# Patient Record
Sex: Female | Born: 1995 | Race: Black or African American | Hispanic: No | Marital: Single | Smoking: Former smoker
Health system: Southern US, Community
[De-identification: ages and names within clinical notes are randomized; demographics above are authoritative.]

## PROBLEM LIST (undated history)

## (undated) DIAGNOSIS — Z30017 Encounter for initial prescription of implantable subdermal contraceptive: Principal | ICD-10-CM

## (undated) DIAGNOSIS — R87629 Unspecified abnormal cytological findings in specimens from vagina: Secondary | ICD-10-CM

## (undated) DIAGNOSIS — Z789 Other specified health status: Secondary | ICD-10-CM

## (undated) HISTORY — DX: Other specified health status: Z78.9

## (undated) HISTORY — PX: KNEE SURGERY: SHX244

## (undated) HISTORY — DX: Unspecified abnormal cytological findings in specimens from vagina: R87.629

## (undated) HISTORY — DX: Encounter for initial prescription of implantable subdermal contraceptive: Z30.017

---
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
## 2016-01-31 ENCOUNTER — Encounter: Payer: Medicaid Other | Admitting: Advanced Practice Midwife

## 2016-01-31 ENCOUNTER — Encounter: Payer: Self-pay | Admitting: Advanced Practice Midwife

## 2016-02-28 ENCOUNTER — Encounter: Payer: Medicaid Other | Admitting: Advanced Practice Midwife

## 2016-03-13 ENCOUNTER — Encounter: Payer: Medicaid Other | Admitting: Advanced Practice Midwife

## 2016-03-26 ENCOUNTER — Ambulatory Visit (INDEPENDENT_AMBULATORY_CARE_PROVIDER_SITE_OTHER): Payer: Medicaid Other | Admitting: Advanced Practice Midwife

## 2016-03-26 ENCOUNTER — Encounter: Payer: Self-pay | Admitting: Advanced Practice Midwife

## 2016-03-26 VITALS — BP 116/64 | HR 88 | Wt 153.0 lb

## 2016-03-26 DIAGNOSIS — Z308 Encounter for other contraceptive management: Secondary | ICD-10-CM | POA: Diagnosis not present

## 2016-03-26 DIAGNOSIS — Z113 Encounter for screening for infections with a predominantly sexual mode of transmission: Secondary | ICD-10-CM

## 2016-03-26 DIAGNOSIS — Z01419 Encounter for gynecological examination (general) (routine) without abnormal findings: Secondary | ICD-10-CM

## 2016-03-26 NOTE — Progress Notes (Signed)
Erica Doyle 20 y.o.  Vitals:   03/26/16 1026  BP: 116/64  Pulse: 88     Filed Weights   03/26/16 1026  Weight: 153 lb (69.4 kg)    Past Medical History: Past Medical History:  Diagnosis Date  . Nexplanon insertion 12/31/2012   nexplanon inserted left arm 12/31/12 remove 01/01/16    Past Surgical History: Past Surgical History:  Procedure Laterality Date  . KNEE SURGERY Left     Family History: Family History  Problem Relation Age of Onset  . Hypertension Maternal Grandmother   . Hypertension Paternal Grandmother     Social History: Social History  Substance Use Topics  . Smoking status: Never Smoker  . Smokeless tobacco: Never Used  . Alcohol use No    Allergies:  Allergies  Allergen Reactions  . Penicillins Other (See Comments)    Unknown       Current Outpatient Prescriptions:  .  BENZACLIN WITH PUMP gel, Apply 1 application topically daily., Disp: , Rfl: 5 .  cyclobenzaprine (FLEXERIL) 10 MG tablet, Take 1 tablet (10 mg total) by mouth 3 (three) times daily as needed for muscle spasms., Disp: 30 tablet, Rfl: 0 .  DIFFERIN 0.1 % gel, Apply 1 application topically at bedtime. *Applied to affected area(s), Disp: , Rfl: 5 .  EPINEPHrine (EPIPEN) 0.3 mg/0.3 mL SOAJ, Inject 0.3 mLs (0.3 mg total) into the muscle as needed., Disp: 2 Device, Rfl: 1 .  etonogestrel (NEXPLANON) 68 MG IMPL implant, Inject 1 each into the skin once., Disp: , Rfl:  .  PROVENTIL HFA 108 (90 BASE) MCG/ACT inhaler, Inhale 2 puffs into the lungs every 4 (four) hours as needed for wheezing or shortness of breath. , Disp: , Rfl: 1 .  silver sulfADIAZINE (SILVADENE) 1 % cream, Apply topically daily. Apply to abrasions daily, Disp: 400 g, Rfl: 0 .  ibuprofen (ADVIL,MOTRIN) 600 MG tablet, Take 1 tablet (600 mg total) by mouth every 6 (six) hours as needed. (Patient not taking: Reported on 03/26/2016), Disp: 30 tablet, Rfl: 0  History of Present Illness: here for Family Planning physical.  Plans removal/reinsertion of nexplanon   Review of Systems   Patient denies any headaches, blurred vision, shortness of breath, chest pain, abdominal pain, problems with bowel movements, urination, or intercourse.   Physical Exam: General:  Well developed, well nourished, no acute distress Skin:  Warm and dry Neck:  Midline trachea, normal thyroid Lungs; Clear to auscultation bilaterally Breast:  No dominant palpable mass, retraction, or nipple discharge Cardiovascular: Regular rate and rhythm Abdomen:  Soft, non tender, no hepatosplenomegaly Pelvic:  External genitalia is normal in appearance.  The vagina is normal in appearance.  The cervix is normal  Uterus is felt to be normal size, shape, and contour.  No adnexal masses or tenderness noted.  Extremities:  No swelling or varicosities noted Psych:  No mood changes.     Impression: normal physical/no pap     Plan: F/U for Nexplanon removal/reinsertion

## 2016-03-27 LAB — GC/CHLAMYDIA PROBE AMP
Chlamydia trachomatis, NAA: NEGATIVE
Neisseria gonorrhoeae by PCR: NEGATIVE

## 2016-04-08 ENCOUNTER — Encounter: Payer: Self-pay | Admitting: Advanced Practice Midwife

## 2016-04-08 ENCOUNTER — Ambulatory Visit (INDEPENDENT_AMBULATORY_CARE_PROVIDER_SITE_OTHER): Payer: Medicaid Other | Admitting: Advanced Practice Midwife

## 2016-04-08 VITALS — BP 112/66 | HR 84 | Ht 61.0 in | Wt 153.0 lb

## 2016-04-08 DIAGNOSIS — Z308 Encounter for other contraceptive management: Secondary | ICD-10-CM

## 2016-04-08 DIAGNOSIS — Z3202 Encounter for pregnancy test, result negative: Secondary | ICD-10-CM | POA: Diagnosis not present

## 2016-04-08 DIAGNOSIS — Z30017 Encounter for initial prescription of implantable subdermal contraceptive: Secondary | ICD-10-CM

## 2016-04-08 DIAGNOSIS — Z3046 Encounter for surveillance of implantable subdermal contraceptive: Secondary | ICD-10-CM

## 2016-04-08 DIAGNOSIS — Z3049 Encounter for surveillance of other contraceptives: Secondary | ICD-10-CM | POA: Diagnosis not present

## 2016-04-08 LAB — POCT URINE PREGNANCY: Preg Test, Ur: NEGATIVE

## 2016-04-08 NOTE — Progress Notes (Signed)
Marsheila L Marse 21 y.o. Vitals:   04/08/16 1146  BP: 112/66  Pulse: 84    Past Medical History:  Diagnosis Date  . Nexplanon insertion 12/31/2012   nexplanon inserted left arm 12/31/12 remove 01/01/16    Family History  Problem Relation Age of Onset  . Hypertension Maternal Grandmother   . Hypertension Paternal Grandmother     Social History   Social History  . Marital status: Single    Spouse name: N/A  . Number of children: N/A  . Years of education: N/A   Occupational History  . Not on file.   Social History Main Topics  . Smoking status: Never Smoker  . Smokeless tobacco: Never Used  . Alcohol use No  . Drug use: No  . Sexual activity: Yes    Birth control/ protection: None, Implant   Other Topics Concern  . Not on file   Social History Narrative  . No narrative on file    HPI:  Patti L Coots is here for Nexplanon removal and reinsertion.  She was given informed consent for removal of her Nexplanon and reinsertion of a new one.A signed copy is in the chart.  Appropriate time out taken. Nexlanon site (left arm--placement is a little high, looks like it moved a cm or so from insertion site.  However, it does not bother pt and is not near an artery, so will put new one in same spot) identified and thea area was prepped in usual sterile fashon. 2 cc of 1% lidocaine was used to anesthetize the area starting with the distal end of the implant. A small stab incision was made right beside the implant on the distal portion.  The Nexplanon rod was grasped using hemostats and removed without difficulty.  There was less than 3 cc blood loss. There were no complications. Next, the area was cleansed again and the new Nexplanon was inserted without difficulty.  Steri strips and a pressure bandage were applied.  Pt was instructed to remove pressure bandage in a few hours, and keep insertion site covered with a bandaid for 3 days.  Follow-up scheduled PRN  problems  CRESENZO-DISHMAN,Miliano Cotten 04/08/2016 12:06 PM

## 2016-09-13 ENCOUNTER — Encounter (HOSPITAL_COMMUNITY): Payer: Self-pay

## 2016-09-13 ENCOUNTER — Emergency Department (HOSPITAL_COMMUNITY)
Admission: EM | Admit: 2016-09-13 | Discharge: 2016-09-13 | Disposition: A | Discharge status: Home / Self Care | Payer: Self-pay | Attending: Emergency Medicine | Admitting: Emergency Medicine

## 2016-09-13 ENCOUNTER — Emergency Department (HOSPITAL_COMMUNITY): Payer: Self-pay

## 2016-09-13 DIAGNOSIS — Z79899 Other long term (current) drug therapy: Secondary | ICD-10-CM | POA: Insufficient documentation

## 2016-09-13 DIAGNOSIS — R102 Pelvic and perineal pain: Secondary | ICD-10-CM | POA: Insufficient documentation

## 2016-09-13 LAB — URINALYSIS, ROUTINE W REFLEX MICROSCOPIC
BILIRUBIN URINE: NEGATIVE
Glucose, UA: NEGATIVE mg/dL
HGB URINE DIPSTICK: NEGATIVE
Ketones, ur: NEGATIVE mg/dL
Leukocytes, UA: NEGATIVE
NITRITE: NEGATIVE
PROTEIN: NEGATIVE mg/dL
Specific Gravity, Urine: 1.024 (ref 1.005–1.030)
pH: 5 (ref 5.0–8.0)

## 2016-09-13 LAB — PREGNANCY, URINE: Preg Test, Ur: NEGATIVE

## 2016-09-13 LAB — WET PREP, GENITAL
Clue Cells Wet Prep HPF POC: NONE SEEN
SPERM: NONE SEEN
TRICH WET PREP: NONE SEEN
Yeast Wet Prep HPF POC: NONE SEEN

## 2016-09-13 MED ORDER — KETOROLAC TROMETHAMINE 60 MG/2ML IM SOLN
30.0000 mg | Freq: Once | INTRAMUSCULAR | Status: AC
  Administered 2016-09-13: 30 mg via INTRAMUSCULAR
  Filled 2016-09-13: qty 2

## 2016-09-13 NOTE — ED Triage Notes (Signed)
Pt c/o intermittent sharp lower abd pain x 1 month.  Denies any urinary symptoms, denies any abnormal vaginal bleeding or discharge.  LMP was in January because of her birth control.

## 2016-09-13 NOTE — ED Notes (Signed)
ED Provider at bedside. 

## 2016-09-13 NOTE — ED Provider Notes (Signed)
[facility] DEPT Provider Note   CSN: [id number] Arrival date & time: 09/13/16  4098     History   Chief Complaint Chief Complaint  Patient presents with  . Abdominal Pain    HPI Erica Doyle is a 21 y.o. female.  The history is provided by the patient.  Female GU Problem  This is a new problem. The current episode started more than 1 week ago. The problem occurs every several days. The problem has been gradually worsening. Pertinent negatives include no chest pain, no abdominal pain, no headaches and no shortness of breath. Nothing aggravates the symptoms. Nothing relieves the symptoms. She has tried nothing for the symptoms. The treatment provided no relief.    21 year old female who presents with pelvic pain. She has no prior abdominal surgeries. Reports intermittent low pelvic pain for 1 month, sharp in nature. Woke up this morning with with right-sided pelvic pain. Denies taking any treatments for this. Felt subjective fever yesterday. Denies any abnormal vaginal bleeding or discharge. Denies dysuria, urinary frequency, hematuria, flank pain, nausea or vomiting, diarrhea. Reports implanon. Endorses being sexually active, and uses barrier protection always. LMP in January due to birth control usage.  Past Medical History:  Diagnosis Date  . Nexplanon insertion 12/31/2012   nexplanon inserted left arm 12/31/12 remove 01/01/16    Patient Active Problem List   Diagnosis Date Noted  . Nexplanon insertion 12/31/2012  . Encounter for education about contraceptive use 12/14/2012  . Leg weakness 10/15/2012  . Knee pain 07/23/2011    Past Surgical History:  Procedure Laterality Date  . KNEE SURGERY Left     OB History    No data available       Home Medications    Prior to Admission medications   Medication Sig Start Date End Date Taking? Authorizing Provider  etonogestrel (NEXPLANON) 68 MG IMPL implant Inject 1 each into the skin once.   Yes [provider]  ibuprofen (ADVIL,MOTRIN) 600 MG tablet Take 1 tablet (600 mg total) by mouth every 6 (six) hours as needed. 07/20/14  Yes Pollina, Canary Heaslip, MD  BENZACLIN WITH PUMP gel Apply 1 application topically daily. 07/11/14   [provider]  cyclobenzaprine (FLEXERIL) 10 MG tablet Take 1 tablet (10 mg total) by mouth 3 (three) times daily as needed for muscle spasms. Patient not taking: Reported on 04/08/2016 07/20/14   Gilda Crease, MD  DIFFERIN 0.1 % gel Apply 1 application topically at bedtime. *Applied to affected area(s) 07/12/14   [provider]  EPINEPHrine (EPIPEN) 0.3 mg/0.3 mL SOAJ Inject 0.3 mLs (0.3 mg total) into the muscle as needed. Patient not taking: Reported on 09/13/2016 09/05/12   Zadie Rhine, MD  PROVENTIL HFA 108 (90 BASE) MCG/ACT inhaler Inhale 2 puffs into the lungs every 4 (four) hours as needed for wheezing or shortness of breath.  07/11/14   [provider]  silver sulfADIAZINE (SILVADENE) 1 % cream Apply topically daily. Apply to abrasions daily Patient not taking: Reported on 04/08/2016 09/17/12   Devoria Albe, MD    Family History Family History  Problem Relation Age of Onset  . Hypertension Maternal Grandmother   . Hypertension Paternal Grandmother     Social History Social History  Substance Use Topics  . Smoking status: Never Smoker  . Smokeless tobacco: Never Used  . Alcohol use No     Allergies   Penicillins   Review of Systems Review of Systems  Respiratory: Negative for shortness of breath.  Cardiovascular: Negative for chest pain.  Gastrointestinal: Negative for abdominal pain.  Genitourinary: Positive for pelvic pain. Negative for dysuria.  Neurological: Negative for headaches.  All other systems reviewed and are negative.    Physical Exam Updated Vital Signs BP 121/80 (BP Location: Right Arm)   Pulse 77   Temp 98.4 F (36.9 C) (Oral)   Resp 18   Ht 5' (1.524 m)   Wt 63 kg (139 lb)   LMP  03/16/2016 (Approximate)   SpO2 100%   BMI 27.15 kg/m   Physical Exam Physical Exam  Nursing note and vitals reviewed. Constitutional: Well developed, well nourished, non-toxic, and in no acute distress Head: Normocephalic and atraumatic.  Mouth/Throat: Oropharynx is clear and moist.  Neck: Normal range of motion. Neck supple.  Cardiovascular: Normal rate and regular rhythm.   Pulmonary/Chest: Effort normal and breath sounds normal.  Abdominal: Soft. There is right low pelvic tenderness. No significant tenderness at McBurney's point. No CVA tenderness. There is no rebound and no guarding.  Musculoskeletal: Normal range of motion.  Neurological: Alert, no facial droop, fluent speech, moves all extremities symmetrically Skin: Skin is warm and dry.  Psychiatric: Cooperative Pelvic: Normal external genitalia. Normal internal genitalia. No discharge. No blood within the vagina. No cervical motion tenderness. No adnexal masses or tenderness.    ED Treatments / Results  Labs (all labs ordered are listed, but only abnormal results are displayed) Labs Reviewed  WET PREP, GENITAL - Abnormal; Notable for the following:       Result Value   WBC, Wet Prep HPF POC FEW (*)    All other components within normal limits  URINALYSIS, ROUTINE W REFLEX MICROSCOPIC - Abnormal; Notable for the following:    APPearance HAZY (*)    All other components within normal limits  PREGNANCY, URINE  GC/CHLAMYDIA PROBE AMP ([place]) NOT AT [facility]    EKG  EKG Interpretation None       Radiology US Transvaginal Non-ob  Result Date: 09/13/2016 CLINICAL DATA:  21 year old female with intermittent lower pelvic pain which is more severe today. EXAM: TRANSABDOMINAL AND TRANSVAGINAL ULTRASOUND OF PELVIS DOPPLER ULTRASOUND OF OVARIES TECHNIQUE: Both transabdominal and transvaginal ultrasound examinations of the pelvis were performed. Transabdominal technique was performed for global imaging of the pelvis  including uterus, ovaries, adnexal regions, and pelvic cul-de-sac. It was necessary to proceed with endovaginal exam following the transabdominal exam to visualize the right ovary. Color and duplex Doppler ultrasound was utilized to evaluate blood flow to the ovaries. COMPARISON:  None. FINDINGS: Uterus Measurements: 5.4 x 2.6 x 3.4 cm. No fibroids or other mass visualized. Endometrium Thickness: 2-3 mm.  No focal abnormality visualized. Right ovary Measurements: 3.1 x 1.5 x 2.3 cm. Normal appearance/no adnexal mass. Left ovary Measurements: 3.0 x 1.5 x 2.3 cm. Normal appearance/no adnexal mass. Pulsed Doppler evaluation of both ovaries demonstrates normal low-resistance arterial and venous waveforms. Other findings No abnormal free fluid. IMPRESSION: 1. Normal arterial and venous flow in both ovaries. No evidence of torsion at this time. 2. Unremarkable sonographic survey of the pelvis. Electronically Signed   By: Malachy Moan M.D.   On: 09/13/2016 10:24   US Pelvis Complete  Result Date: 09/13/2016 CLINICAL DATA:  21 year old female with intermittent lower pelvic pain which is more severe today. EXAM: TRANSABDOMINAL AND TRANSVAGINAL ULTRASOUND OF PELVIS DOPPLER ULTRASOUND OF OVARIES TECHNIQUE: Both transabdominal and transvaginal ultrasound examinations of the pelvis were performed. Transabdominal technique was performed for global imaging of the pelvis including uterus,  ovaries, adnexal regions, and pelvic cul-de-sac. It was necessary to proceed with endovaginal exam following the transabdominal exam to visualize the right ovary. Color and duplex Doppler ultrasound was utilized to evaluate blood flow to the ovaries. COMPARISON:  None. FINDINGS: Uterus Measurements: 5.4 x 2.6 x 3.4 cm. No fibroids or other mass visualized. Endometrium Thickness: 2-3 mm.  No focal abnormality visualized. Right ovary Measurements: 3.1 x 1.5 x 2.3 cm. Normal appearance/no adnexal mass. Left ovary Measurements: 3.0 x 1.5 x  2.3 cm. Normal appearance/no adnexal mass. Pulsed Doppler evaluation of both ovaries demonstrates normal low-resistance arterial and venous waveforms. Other findings No abnormal free fluid. IMPRESSION: 1. Normal arterial and venous flow in both ovaries. No evidence of torsion at this time. 2. Unremarkable sonographic survey of the pelvis. Electronically Signed   By: Malachy MoanHeath  McCullough M.D.   On: 09/13/2016 10:24   [place] Art/ven Flow Abd Pelv Doppler  Result Date: 09/13/2016 CLINICAL DATA:  21 year old female with intermittent lower pelvic pain which is more severe today. EXAM: TRANSABDOMINAL AND TRANSVAGINAL ULTRASOUND OF PELVIS DOPPLER ULTRASOUND OF OVARIES TECHNIQUE: Both transabdominal and transvaginal ultrasound examinations of the pelvis were performed. Transabdominal technique was performed for global imaging of the pelvis including uterus, ovaries, adnexal regions, and pelvic cul-de-sac. It was necessary to proceed with endovaginal exam following the transabdominal exam to visualize the right ovary. Color and duplex Doppler ultrasound was utilized to evaluate blood flow to the ovaries. COMPARISON:  None. FINDINGS: Uterus Measurements: 5.4 x 2.6 x 3.4 cm. No fibroids or other mass visualized. Endometrium Thickness: 2-3 mm.  No focal abnormality visualized. Right ovary Measurements: 3.1 x 1.5 x 2.3 cm. Normal appearance/no adnexal mass. Left ovary Measurements: 3.0 x 1.5 x 2.3 cm. Normal appearance/no adnexal mass. Pulsed Doppler evaluation of both ovaries demonstrates normal low-resistance arterial and venous waveforms. Other findings No abnormal free fluid. IMPRESSION: 1. Normal arterial and venous flow in both ovaries. No evidence of torsion at this time. 2. Unremarkable sonographic survey of the pelvis. Electronically Signed   By: Malachy MoanHeath  McCullough M.D.   On: 09/13/2016 10:24    Procedures Procedures (including critical care time)  Medications Ordered in ED Medications  ketorolac (TORADOL) injection  30 mg (30 mg Intramuscular Given 09/13/16 0847)     Initial Impression / Assessment and Plan / ED Course  I have reviewed the triage vital signs and the nursing notes.  Pertinent labs & imaging results that were available during my care of the patient were reviewed by me and considered in my medical decision making (see chart for details).     21 year old female presenting with intermittent low pelvic/abdominal pain. She is well-appearing. Soft and nonsurgical abdomen. Vital signs are within normal limits.  Pain primarily localized in the right adnexa on initial exam, but with bimanual and pelvic exam reports that pain traveled to the low suprapubic abdomen. Will obtain UA and pregnancy test. Will perform pelvic exam to evaluate for PID/TOA. We'll obtain pelvic ultrasound pending pregnancy test to evaluate for ovarian cyst vs torsion if negative pregnancy test.  She is not pregnant. UA is unremarkable. Wet prep does not show evidence of vaginitis. Exam is not consistent with PID. Pelvic ultrasound is performed. No evidence of torsion, ovarian cyst, or other pelvic etiology. Her abdomen remained soft and benign and I do not suspect intra-abdominal process such as appendicitis. I discussed continued supportive care with over-the-counter analgesics. Strict return and follow-up instructions reviewed. She expressed understanding of all discharge instructions and felt comfortable with  the plan of care.   Final Clinical Impressions(s) / ED Diagnoses   Final diagnoses:  Pelvic pain    New Prescriptions New Prescriptions   No medications on file     Lavera Guise, MD 09/13/16 1144

## 2016-09-13 NOTE — Discharge Instructions (Signed)
Your ultrasound is normal. There were no abnormalities with the ovaries or uterus.  Continue to take ibuprofen and tylenol for pain.  Return for worsening symptoms, including fever, intractable vomiting, escalating pain or any other symptoms concerning to you.

## 2016-09-15 LAB — GC/CHLAMYDIA PROBE AMP (~~LOC~~) NOT AT ARMC
Chlamydia: NEGATIVE
Neisseria Gonorrhea: NEGATIVE

## 2016-10-03 ENCOUNTER — Encounter (HOSPITAL_COMMUNITY): Payer: Self-pay | Admitting: Emergency Medicine

## 2016-10-03 ENCOUNTER — Emergency Department (HOSPITAL_COMMUNITY)
Admission: EM | Admit: 2016-10-03 | Discharge: 2016-10-03 | Disposition: A | Discharge status: Home / Self Care | Payer: Self-pay | Attending: Emergency Medicine | Admitting: Emergency Medicine

## 2016-10-03 DIAGNOSIS — S40862A Insect bite (nonvenomous) of left upper arm, initial encounter: Secondary | ICD-10-CM | POA: Insufficient documentation

## 2016-10-03 DIAGNOSIS — W57XXXA Bitten or stung by nonvenomous insect and other nonvenomous arthropods, initial encounter: Secondary | ICD-10-CM | POA: Insufficient documentation

## 2016-10-03 DIAGNOSIS — Y990 Civilian activity done for income or pay: Secondary | ICD-10-CM | POA: Insufficient documentation

## 2016-10-03 DIAGNOSIS — Y939 Activity, unspecified: Secondary | ICD-10-CM | POA: Insufficient documentation

## 2016-10-03 DIAGNOSIS — Y9289 Other specified places as the place of occurrence of the external cause: Secondary | ICD-10-CM | POA: Insufficient documentation

## 2016-10-03 MED ORDER — SULFAMETHOXAZOLE-TRIMETHOPRIM 800-160 MG PO TABS: 1.0000 | ORAL_TABLET | Freq: Two times a day (BID) | ORAL | 0 refills | Status: AC

## 2016-10-03 MED ORDER — DIPHENHYDRAMINE HCL 25 MG PO CAPS
25.0000 mg | ORAL_CAPSULE | Freq: Once | ORAL | Status: AC
  Administered 2016-10-03: 25 mg via ORAL
  Filled 2016-10-03: qty 1

## 2016-10-03 MED ORDER — PREDNISONE 20 MG PO TABS
40.0000 mg | ORAL_TABLET | Freq: Once | ORAL | Status: AC
  Administered 2016-10-03: 40 mg via ORAL
  Filled 2016-10-03: qty 2

## 2016-10-03 MED ORDER — PREDNISONE 20 MG PO TABS: 40.0000 mg | ORAL_TABLET | Freq: Every day | ORAL | 0 refills | Status: DC

## 2016-10-03 NOTE — Discharge Instructions (Signed)
Continue taking Benadryl, one capsule every 4-6 hours as needed. Apply cool compresses or soaks. Return here for any worsening symptoms.

## 2016-10-03 NOTE — ED Provider Notes (Signed)
[facility] DE[facility] Provider Note   CSN: [id number] Arrival date & time: 10/03/16  1202     History   Chief Complaint Chief Complaint  Patient presents with  . Insect Bite    HPI Erica Doyle is a 21 y.o. female.  HPI   Erica Doyle is a 21 y.o. female who presents to the Emergency Department complaining of pain and itching to left upper arm. She states that she felt something bite or sting her left upper arm yesterday while at work. States that she works in a [employer]. She noticed redness and swelling to the area. She has applied ice and took 1 Benadryl tablet last evening without relief. She denies pain with movement of the arm. She also denies fever, chills, numbness or tingling of the extremity.   Past Medical History:  Diagnosis Date  . Nexplanon insertion 12/31/2012   nexplanon inserted left arm 12/31/12 remove 01/01/16    Patient Active Problem List   Diagnosis Date Noted  . Nexplanon insertion 12/31/2012  . Encounter for education about contraceptive use 12/14/2012  . Leg weakness 10/15/2012  . Knee pain 07/23/2011    Past Surgical History:  Procedure Laterality Date  . KNEE SURGERY Left     OB History    No data available       Home Medications    Prior to Admission medications   Medication Sig Start Date End Date Taking? Authorizing Provider  [employer] WITH [employer] gel Apply 1 application topically daily. 07/11/14   [provider]  cyclobenzaprine (FLEXERIL) 10 MG tablet Take 1 tablet (10 mg total) by mouth 3 (three) times daily as needed for muscle spasms. Patient not taking: Reported on 04/08/2016 07/20/14   Gilda CreasePollina, Christopher J, MD  DIFFERIN 0.1 % gel Apply 1 application topically at bedtime. *Applied to affected area(s) 07/12/14   [provider]  EPINEPHrine (EPIPEN) 0.3 mg/0.3 mL SOAJ Inject 0.3 mLs (0.3 mg total) into the muscle as needed. Patient not taking: Reported on 09/13/2016 09/05/12   Zadie RhineWickline, Donald, MD  etonogestrel  (NEXPLANON) 68 MG IMPL implant Inject 1 each into the skin once.    [provider]  ibuprofen (ADVIL,MOTRIN) 600 MG tablet Take 1 tablet (600 mg total) by mouth every 6 (six) hours as needed. 07/20/14   Gilda CreasePollina, Christopher J, MD  PROVENTIL HFA 108 (90 BASE) MCG/ACT inhaler Inhale 2 puffs into the lungs every 4 (four) hours as needed for wheezing or shortness of breath.  07/11/14   [provider]  silver sulfADIAZINE (SILVADENE) 1 % cream Apply topically daily. Apply to abrasions daily Patient not taking: Reported on 04/08/2016 09/17/12   Devoria AlbeKnapp, Iva, MD    Family History Family History  Problem Relation Age of Onset  . Hypertension Maternal Grandmother   . Hypertension Paternal Grandmother     Social History Social History  Substance Use Topics  . Smoking status: Never Smoker  . Smokeless tobacco: Never Used  . Alcohol use No     Allergies   Penicillins   Review of Systems Review of Systems  Constitutional: Negative for activity change, appetite change, chills and fever.  HENT: Negative for facial swelling, sore throat and trouble swallowing.   Respiratory: Negative for chest tightness, shortness of breath and wheezing.   Musculoskeletal: Negative for neck pain and neck stiffness.  Skin: Negative for rash and wound.       Insect bite left upper arm  Neurological: Negative for dizziness, weakness, numbness and headaches.  All  other systems reviewed and are negative.    Physical Exam Updated Vital Signs BP 119/75 (BP Location: Right Arm)   Pulse 83   Temp 98.3 F (36.8 C) (Oral)   Resp 16   Ht 5\' 1"  (1.549 m)   Wt 62.6 kg (138 lb)   SpO2 100%   BMI 26.07 kg/m   Physical Exam  Constitutional: She is oriented to person, place, and time. She appears well-developed and well-nourished. No distress.  HENT:  Head: Normocephalic and atraumatic.  Mouth/Throat: Oropharynx is clear and moist.  Neck: Normal range of motion. Neck supple.  Cardiovascular:  Normal rate, regular rhythm and intact distal pulses.   No murmur heard. Pulmonary/Chest: Effort normal and breath sounds normal. No respiratory distress.  Musculoskeletal: She exhibits no edema or tenderness.  Lymphadenopathy:    She has no cervical adenopathy.  Neurological: She is alert and oriented to person, place, and time. No sensory deficit. She exhibits normal muscle tone. Coordination normal.  Skin: Skin is warm. Capillary refill takes less than 2 seconds. No rash noted. There is erythema.  Single, slightly raised, erythematous area to the left upper arm with central pin point papule.  No induration or fluctuance.    Nursing note and vitals reviewed.    ED Treatments / Results  Labs (all labs ordered are listed, but only abnormal results are displayed) Labs Reviewed - No data to display  EKG  EKG Interpretation None       Radiology No results found.  Procedures Procedures (including critical care time)  Medications Ordered in ED Medications  diphenhydrAMINE (BENADRYL) capsule 25 mg (not administered)  predniSONE (DELTASONE) tablet 40 mg (not administered)     Initial Impression / Assessment and Plan / ED Course  I have reviewed the triage vital signs and the nursing notes.  Pertinent labs & imaging results that were available during my care of the patient were reviewed by me and considered in my medical decision making (see chart for details).     Patient well appearing. Vital signs stable. No concerning symptoms for abscess. Leading edge of erythema was marked by me. No lymphangitis. Likely insect bite. Patient agrees to cool compresses, Benadryl, and steroid. Return precautions discussed.  Final Clinical Impressions(s) / ED Diagnoses   Final diagnoses:  Insect bite, initial encounter    New Prescriptions New Prescriptions   No medications on file     Pauline Aus, PA-C 10/03/16 1257    Samuel Jester, DO 10/05/16 [phone]

## 2016-10-03 NOTE — ED Triage Notes (Signed)
Insect bite to L upper arm yesterday   NO PCP

## 2016-10-03 NOTE — ED Triage Notes (Signed)
Patient states she was bit by "something" yesterday at work. Complaining of pain and redness to upper left arm.

## 2018-04-18 IMAGING — US US ART/VEN ABD/PELV/SCROTUM DOPPLER LTD
1 series · 13 of 25 positions shown · non-contrast
Comparison: None.

CLINICAL DATA: 20-year-old female with intermittent lower pelvic
pain which is more severe today.

EXAM:
TRANSABDOMINAL AND TRANSVAGINAL ULTRASOUND OF PELVIS
DOPPLER ULTRASOUND OF OVARIES
TECHNIQUE: Both transabdominal and transvaginal ultrasound examinations of the
pelvis were performed. Transabdominal technique was performed for
global imaging of the pelvis including uterus, ovaries, adnexal
regions, and pelvic cul-de-sac.
It was necessary to proceed with endovaginal exam following the
transabdominal exam to visualize the right ovary. Color and duplex
Doppler ultrasound was utilized to evaluate blood flow to the
ovaries.

[Series 1: us art/ven abd/pelv/scrotum doppler ltd · 0.18mm/px · 13 of 67 slices shown]
[im 1/67]
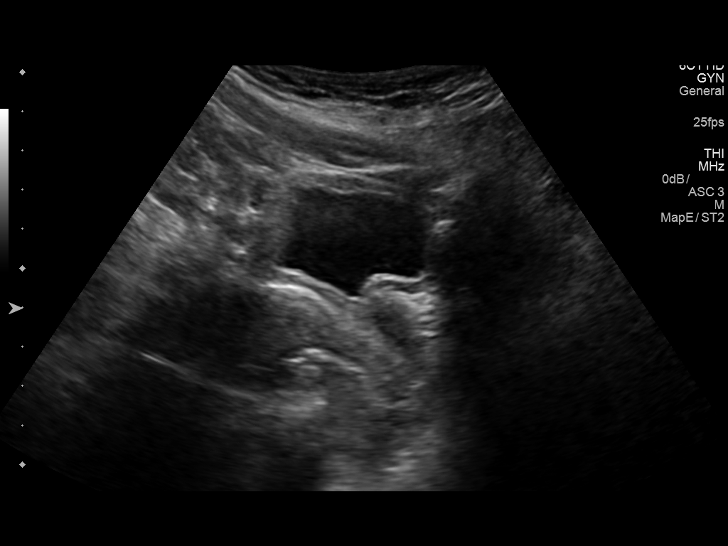
[im 6/67]
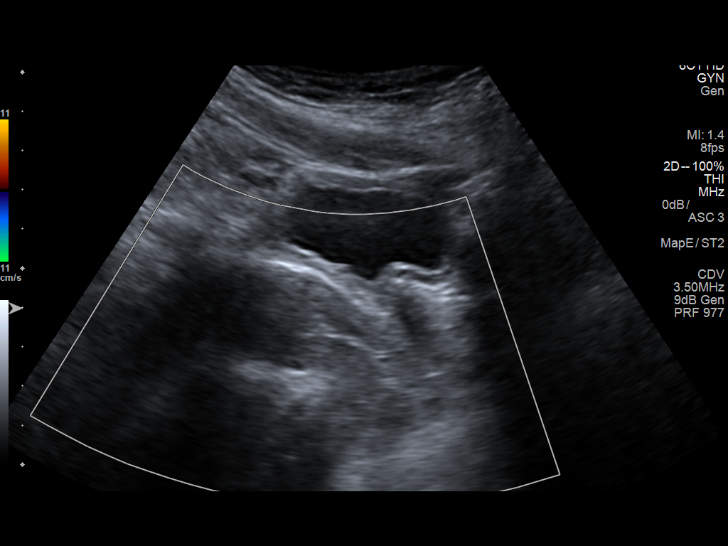
[im 12/67]
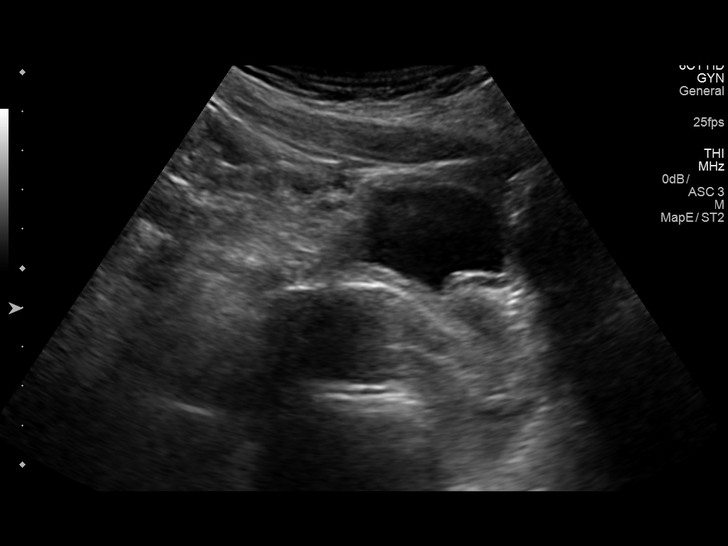
[im 17/67]
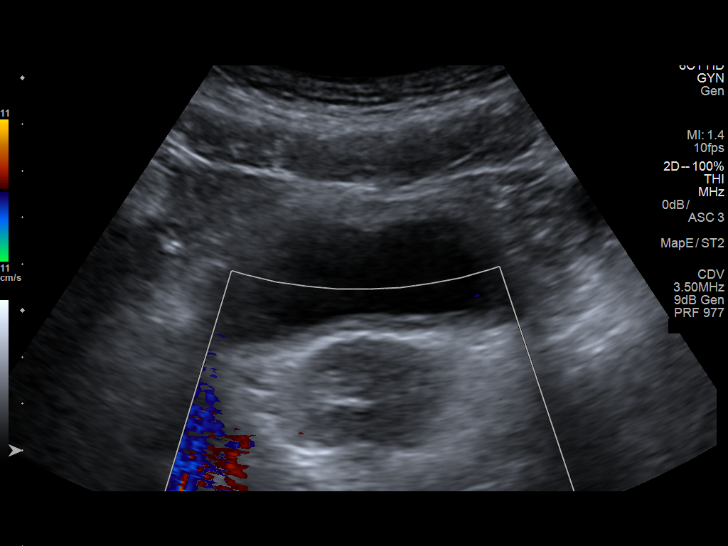
[im 23/67]
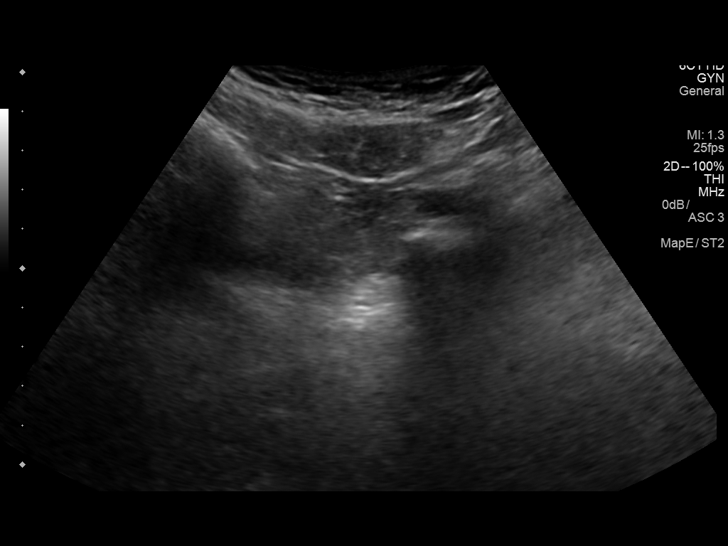
[im 28/67]
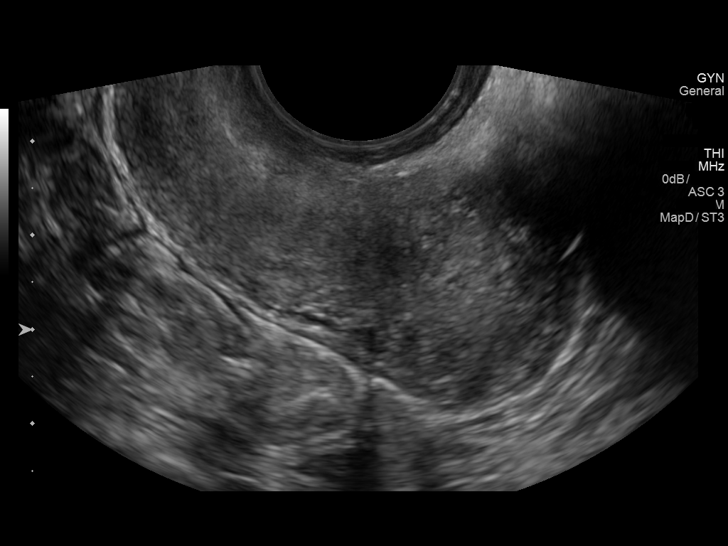
[im 34/67]
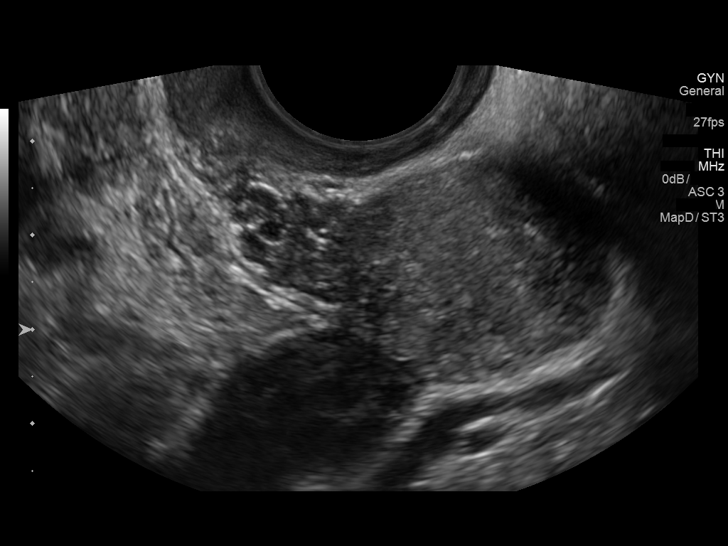
[im 39/67]
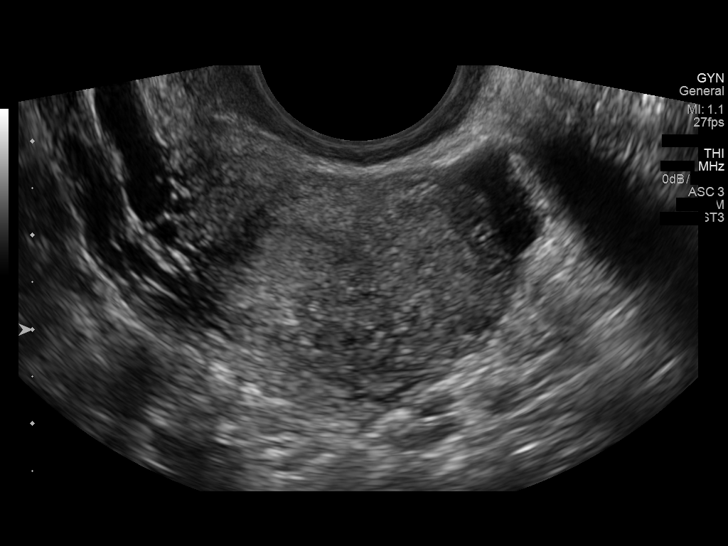
[im 45/67]
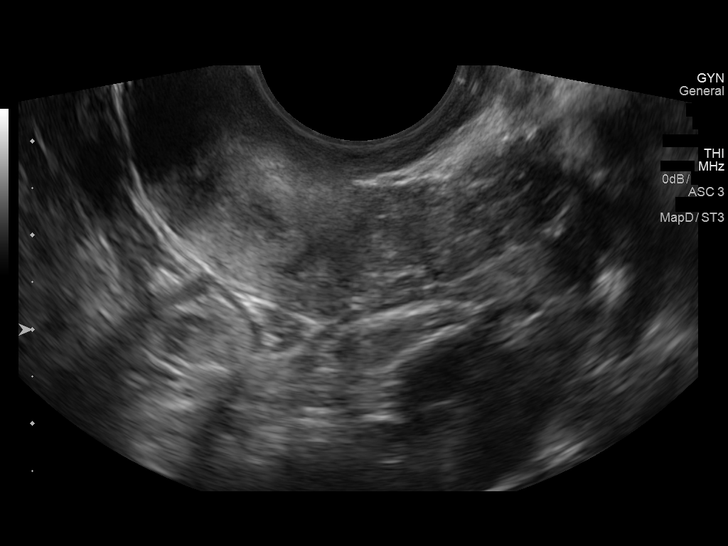
[im 50/67]
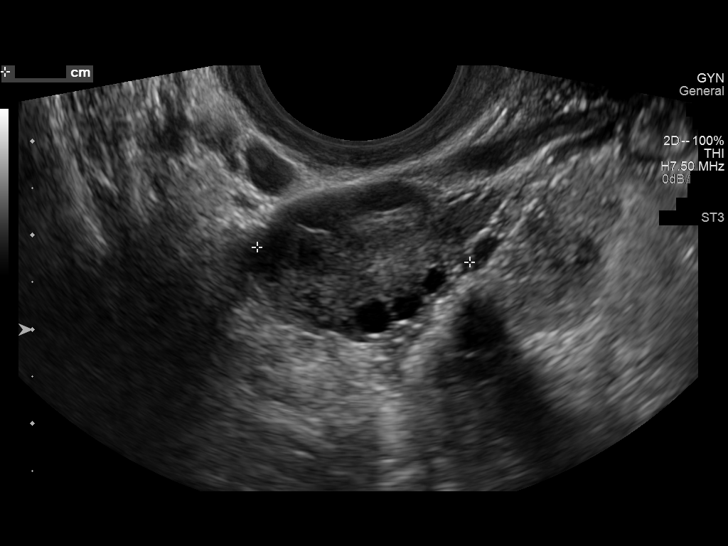
[im 56/67]
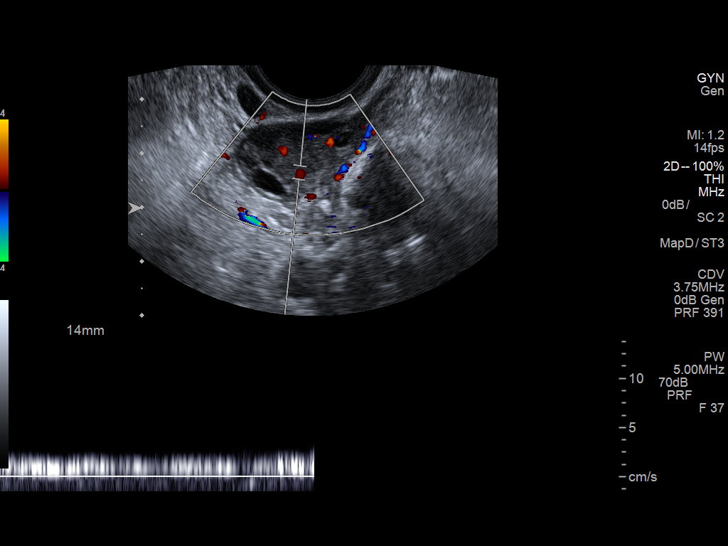
[im 61/67]
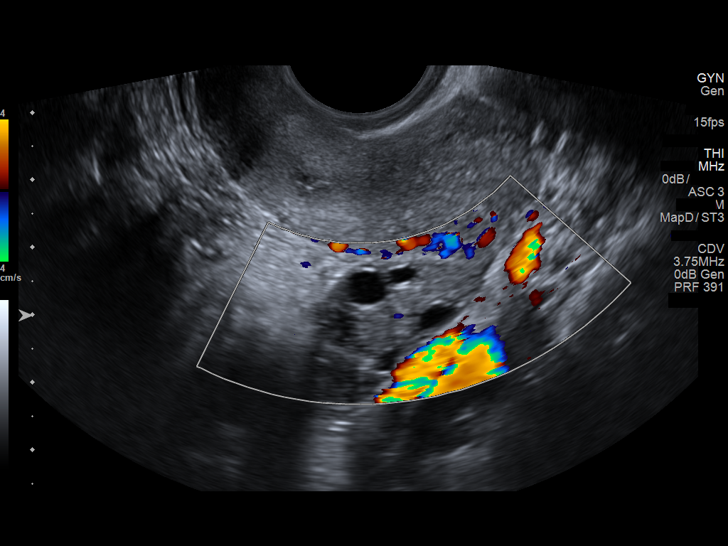
[im 67/67]
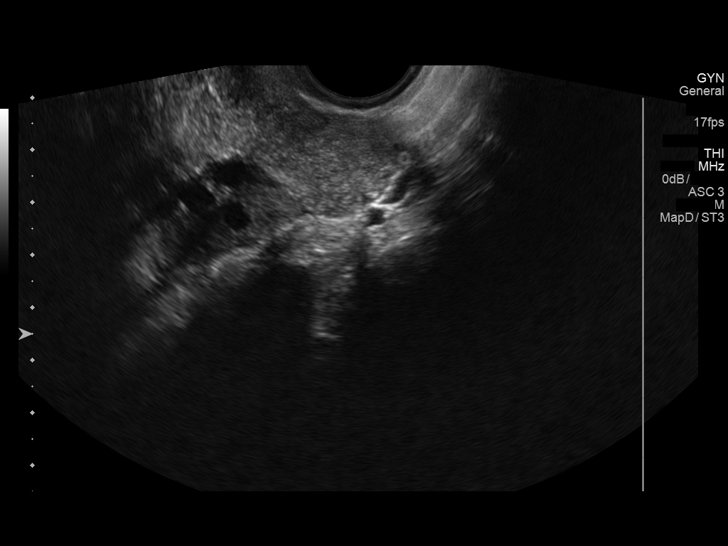

[13 of 25 positions shown; findings below may reference images not displayed]

FINDINGS: Uterus

Measurements: 5.4 x 2.6 x 3.4 cm. No fibroids or other mass
visualized.

Endometrium

Thickness: 2-3 mm.  No focal abnormality visualized.

Right ovary

Measurements: 3.1 x 1.5 x 2.3 cm. Normal appearance/no adnexal mass.

Left ovary

Measurements: 3.0 x 1.5 x 2.3 cm. Normal appearance/no adnexal mass.

Pulsed Doppler evaluation of both ovaries demonstrates normal
low-resistance arterial and venous waveforms.

Other findings

No abnormal free fluid.
IMPRESSION: 1. Normal arterial and venous flow in both ovaries. No evidence of
torsion at this time.
2. Unremarkable sonographic survey of the pelvis.

## 2018-09-29 ENCOUNTER — Other Ambulatory Visit: Payer: Self-pay

## 2018-09-29 ENCOUNTER — Encounter (HOSPITAL_COMMUNITY): Payer: Self-pay | Admitting: *Deleted

## 2018-09-29 ENCOUNTER — Emergency Department (HOSPITAL_COMMUNITY)
Admission: EM | Admit: 2018-09-29 | Discharge: 2018-09-29 | Disposition: A | Discharge status: Home / Self Care | Payer: No Typology Code available for payment source | Attending: Emergency Medicine | Admitting: Emergency Medicine

## 2018-09-29 ENCOUNTER — Emergency Department (HOSPITAL_COMMUNITY): Payer: No Typology Code available for payment source

## 2018-09-29 DIAGNOSIS — Y92410 Unspecified street and highway as the place of occurrence of the external cause: Secondary | ICD-10-CM | POA: Diagnosis not present

## 2018-09-29 DIAGNOSIS — Y9389 Activity, other specified: Secondary | ICD-10-CM | POA: Diagnosis not present

## 2018-09-29 DIAGNOSIS — Z79899 Other long term (current) drug therapy: Secondary | ICD-10-CM | POA: Diagnosis not present

## 2018-09-29 DIAGNOSIS — M5489 Other dorsalgia: Secondary | ICD-10-CM | POA: Diagnosis not present

## 2018-09-29 DIAGNOSIS — Y999 Unspecified external cause status: Secondary | ICD-10-CM | POA: Insufficient documentation

## 2018-09-29 DIAGNOSIS — M549 Dorsalgia, unspecified: Secondary | ICD-10-CM

## 2018-09-29 DIAGNOSIS — S3992XA Unspecified injury of lower back, initial encounter: Secondary | ICD-10-CM | POA: Diagnosis present

## 2018-09-29 MED ORDER — IBUPROFEN 400 MG PO TABS
400.0000 mg | ORAL_TABLET | Freq: Once | ORAL | Status: AC
  Administered 2018-09-29: 400 mg via ORAL
  Filled 2018-09-29: qty 1
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
## 2018-09-29 NOTE — Discharge Instructions (Signed)
Apply ice for 30 minutes at a time, 4 times a day. ° °Take ibuprofen or naproxen as needed for pain. ° °Add acetaminophen as needed if you need additional pain relief. °

## 2018-10-08 ENCOUNTER — Ambulatory Visit
Admission: EM | Admit: 2018-10-08 | Discharge: 2018-10-08 | Disposition: A | Discharge status: Home / Self Care | Payer: Self-pay | Attending: Emergency Medicine | Admitting: Emergency Medicine

## 2018-10-08 ENCOUNTER — Other Ambulatory Visit: Payer: Self-pay

## 2018-10-08 DIAGNOSIS — M62830 Muscle spasm of back: Secondary | ICD-10-CM

## 2018-10-08 DIAGNOSIS — M546 Pain in thoracic spine: Secondary | ICD-10-CM

## 2018-10-08 MED ORDER — CYCLOBENZAPRINE HCL 10 MG PO TABS: 10.0000 mg | ORAL_TABLET | Freq: Two times a day (BID) | ORAL | 0 refills | Status: DC | PRN

## 2018-10-08 MED ORDER — PREDNISONE 20 MG PO TABS: 20.0000 mg | ORAL_TABLET | Freq: Two times a day (BID) | ORAL | 0 refills | Status: AC
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
## 2018-10-08 NOTE — Discharge Instructions (Signed)
Rest, ice, heat, and gentle stretches as needed Ensure adequate range of motion as tolerated. Injuries all appear to be muscular in nature at this time Prescribed prednisone take as directed and to completion inflammation and pain relief.   Prescribed flexeril as needed at bedtime for muscle spasm.  Do not drive or operate heavy machinery while taking this medication Expect some increased pain in the next 1-3 days.  It may take 3-4 weeks for complete resolution of symptoms Will f/u with her orthopedist, PCP, or here if not seeing significant improvement within one week. Return here or go to ER if you have any new or worsening symptoms such as numbness/tingling of the inner thighs, loss of bladder or bowel control, headache/blurry vision, nausea/vomiting, confusion/altered mental status, dizziness, weakness, passing out, imbalance, etc..Marland Kitchen
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
## 2018-12-13 ENCOUNTER — Emergency Department (HOSPITAL_COMMUNITY)
Admission: EM | Admit: 2018-12-13 | Discharge: 2018-12-14 | Discharge status: Against Medical Advice | Payer: Self-pay | Attending: Emergency Medicine | Admitting: Emergency Medicine

## 2018-12-13 ENCOUNTER — Encounter (HOSPITAL_COMMUNITY): Payer: Self-pay | Admitting: Emergency Medicine

## 2018-12-13 ENCOUNTER — Other Ambulatory Visit: Payer: Self-pay

## 2018-12-13 DIAGNOSIS — Z5321 Procedure and treatment not carried out due to patient leaving prior to being seen by health care provider: Secondary | ICD-10-CM | POA: Diagnosis not present

## 2018-12-13 DIAGNOSIS — M542 Cervicalgia: Secondary | ICD-10-CM | POA: Diagnosis present
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
## 2018-12-14 ENCOUNTER — Other Ambulatory Visit: Payer: Self-pay

## 2018-12-14 ENCOUNTER — Ambulatory Visit
Admission: EM | Admit: 2018-12-14 | Discharge: 2018-12-14 | Disposition: A | Discharge status: Home / Self Care | Payer: Self-pay | Attending: Emergency Medicine | Admitting: Emergency Medicine

## 2018-12-14 DIAGNOSIS — M62838 Other muscle spasm: Secondary | ICD-10-CM

## 2018-12-14 DIAGNOSIS — S161XXA Strain of muscle, fascia and tendon at neck level, initial encounter: Secondary | ICD-10-CM

## 2018-12-14 MED ORDER — CYCLOBENZAPRINE HCL 10 MG PO TABS: 10.0000 mg | ORAL_TABLET | Freq: Every day | ORAL | 0 refills | Status: DC

## 2018-12-14 MED ORDER — NAPROXEN 500 MG PO TABS: 500.0000 mg | ORAL_TABLET | Freq: Two times a day (BID) | ORAL | 0 refills | Status: DC
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
## 2018-12-14 NOTE — Discharge Instructions (Addendum)
Rest, ice and heat as needed Ensure adequate range of motion as tolerated. Injuries all appear to be muscular in nature at this time Prescribed naproxen as needed for inflammation and pain relief.  DO NOT TAKE WITH OTHER antiinflammatories, as this may cause GI upset and/or bleed Prescribed flexeril as needed at bedtime for muscle spasm.  Do not drive or operate heavy machinery while taking this medication Expect some increased pain in the next 1-3 days.  It may take 3-4 weeks for complete resolution of symptoms Will f/u with her doctor or here if not seeing significant improvement within one week. Return here or go to ER if you have any new or worsening symptoms such as numbness/tingling of the inner thighs, loss of bladder or bowel control, headache/blurry vision, nausea/vomiting, confusion/altered mental status, dizziness, weakness, passing out, imbalance, etc... 
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
## 2018-12-17 ENCOUNTER — Other Ambulatory Visit: Payer: Self-pay

## 2018-12-17 ENCOUNTER — Ambulatory Visit
Admission: EM | Admit: 2018-12-17 | Discharge: 2018-12-17 | Disposition: A | Discharge status: Home / Self Care | Payer: Self-pay | Attending: Emergency Medicine | Admitting: Emergency Medicine

## 2018-12-17 DIAGNOSIS — M62838 Other muscle spasm: Secondary | ICD-10-CM

## 2018-12-17 DIAGNOSIS — S161XXD Strain of muscle, fascia and tendon at neck level, subsequent encounter: Secondary | ICD-10-CM

## 2018-12-17 MED ORDER — KETOROLAC TROMETHAMINE 60 MG/2ML IM SOLN
60.0000 mg | Freq: Once | INTRAMUSCULAR | Status: AC
  Administered 2018-12-17: 12:00:00 60 mg via INTRAMUSCULAR

## 2018-12-17 NOTE — Discharge Instructions (Signed)
Toradol shot given in office Continue conservative management of rest, ice, and gentle stretches Take naproxen as instructed for pain relief (may cause abdominal discomfort, ulcers, and GI bleeds avoid taking with other NSAIDs) Take cyclobenzaprine at nighttime for symptomatic relief. Avoid driving or operating heavy machinery while using medication. Follow up with PCP if symptoms persist Return or go to the ER if you have any new or worsening symptoms (fever, chills, chest pain, abdominal pain, numbness/ tingling, headache, vision changes, nausea, vomiting, symptoms do not improve with medications, etc...)
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
## 2019-04-05 ENCOUNTER — Telehealth: Payer: Self-pay | Admitting: Adult Health

## 2019-04-05 NOTE — Telephone Encounter (Signed)

## 2019-04-06 ENCOUNTER — Encounter: Payer: Self-pay | Admitting: Adult Health

## 2019-04-06 ENCOUNTER — Other Ambulatory Visit (HOSPITAL_COMMUNITY)
Admission: RE | Admit: 2019-04-06 | Discharge: 2019-04-06 | Disposition: A | Discharge status: Home / Self Care | Payer: BC Managed Care – PPO | Source: Ambulatory Visit | Attending: Adult Health | Admitting: Adult Health

## 2019-04-06 ENCOUNTER — Ambulatory Visit (INDEPENDENT_AMBULATORY_CARE_PROVIDER_SITE_OTHER): Payer: BC Managed Care – PPO | Admitting: Adult Health

## 2019-04-06 ENCOUNTER — Other Ambulatory Visit: Payer: Self-pay

## 2019-04-06 VITALS — BP 126/83 | HR 90 | Ht 61.5 in | Wt 205.0 lb

## 2019-04-06 DIAGNOSIS — Z01419 Encounter for gynecological examination (general) (routine) without abnormal findings: Secondary | ICD-10-CM

## 2019-04-06 DIAGNOSIS — Z3046 Encounter for surveillance of implantable subdermal contraceptive: Secondary | ICD-10-CM | POA: Diagnosis not present

## 2019-04-06 NOTE — Patient Instructions (Addendum)
Get OTC PNV Use condoms x 2 weeks, keep clean and dry x 24 hours, no heavy lifting, keep steri strips on x 72 hours, Keep pressure dressing on x 24 hours. Follow up prn problems.

## 2019-04-06 NOTE — Progress Notes (Signed)
Patient ID: ASERET HOFFMAN, female   DOB: 1995/05/08, 24 y.o.   MRN: [id number] History of Present Illness: Mathew is a 24 year old black female in for well woman gyn exam and pap, and nexplanon removal and she does not want any birth control.    Current Medications, Allergies, Past Medical History, Past Surgical History, Family History and Social History were reviewed in [employer] record.     Review of Systems: Patient denies any headaches, hearing loss, fatigue, blurred vision, shortness of breath, chest pain, abdominal pain, problems with bowel movements, urination, or intercourse. No joint pain or mood swings. She did gain weight with nexplanon.    Physical Exam:BP 126/83 (BP Location: Left Arm, Patient Position: Sitting, Cuff Size: Large)   Pulse 90   Ht 5' 1.5" (1.562 m)   Wt 205 lb (93 kg)   LMP 03/24/2019   BMI 38.11 kg/m  General:  Well developed, well nourished, no acute distress Skin:  Warm and dry Neck:  Midline trachea, normal thyroid, good ROM, no lymphadenopathy Lungs; Clear to auscultation bilaterally Breast:  No dominant palpable mass, retraction, or nipple discharge,has bilateral nipple rods Cardiovascular: Regular rate and rhythm Abdomen:  Soft, non tender, no hepatosplenomegaly Pelvic:  External genitalia is normal in appearance, no lesions.  The vagina is normal in appearance. Urethra has no lesions or masses. The cervix is nulliparous pap with CG/CHL performed.  Uterus is felt to be normal size, shape, and contour.  No adnexal masses or tenderness noted.Bladder is non tender, no masses felt. Extremities/musculoskeletal:  No swelling or varicosities noted, no clubbing or cyanosis Psych:  No mood changes, alert and cooperative,seems happy Fall risk is low PHQ 2 score is 0. Examination chaperoned by Malachy Mood LPN. Consent signed,time out called. Left arm cleansed with betadine, and injected with 1.5 cc 1% lidocaine and waited til  numb.Under sterile technique a #11 blade was used to make small vertical incision, and a curved forceps was used to easily remove rod. Steri strips applied. Pressure dressing applied.  Impression and Plan:  1. Encounter for gynecological examination with Papanicolaou smear of cervix Pap sent Physical in 1 year Pap in 3 if normal  2. Nexplanon removal Use condoms x 2 weeks, keep clean and dry x 24 hours, no heavy lifting, keep steri strips on x 72 hours, Keep pressure dressing on x 24 hours. Follow up prn problems. Start OTC PNV

## 2019-04-07 ENCOUNTER — Telehealth: Payer: Self-pay | Admitting: Adult Health

## 2019-04-07 ENCOUNTER — Encounter: Payer: Self-pay | Admitting: Adult Health

## 2019-04-07 DIAGNOSIS — A599 Trichomoniasis, unspecified: Secondary | ICD-10-CM | POA: Insufficient documentation

## 2019-04-07 HISTORY — DX: Trichomoniasis, unspecified: A59.9

## 2019-04-07 LAB — CYTOLOGY - PAP
Adequacy: ABSENT
Chlamydia: NEGATIVE
Comment: NEGATIVE
Comment: NORMAL
Diagnosis: NEGATIVE
Neisseria Gonorrhea: NEGATIVE

## 2019-04-07 MED ORDER — METRONIDAZOLE 500 MG PO TABS: 500.0000 mg | ORAL_TABLET | Freq: Two times a day (BID) | ORAL | 0 refills | Status: DC

## 2019-04-07 NOTE — Telephone Encounter (Signed)
Pt aware that pap is negative for malignancy and GC/CHL but +trich , will rx flagyl 500 mg 1 bid x 7 days, no sex and PCO in about 10 days, can treat partner or he can go to PCP or clinic, call back for POC appt

## 2019-04-15 ENCOUNTER — Telehealth: Payer: Self-pay | Admitting: Adult Health

## 2019-04-15 NOTE — Telephone Encounter (Signed)

## 2019-04-18 ENCOUNTER — Ambulatory Visit (INDEPENDENT_AMBULATORY_CARE_PROVIDER_SITE_OTHER): Payer: BC Managed Care – PPO | Admitting: Adult Health

## 2019-04-18 ENCOUNTER — Other Ambulatory Visit: Payer: Self-pay

## 2019-04-18 ENCOUNTER — Encounter: Payer: Self-pay | Admitting: Adult Health

## 2019-04-18 ENCOUNTER — Other Ambulatory Visit (HOSPITAL_COMMUNITY)
Admission: RE | Admit: 2019-04-18 | Discharge: 2019-04-18 | Disposition: A | Discharge status: Home / Self Care | Payer: BC Managed Care – PPO | Source: Ambulatory Visit | Attending: Adult Health | Admitting: Adult Health

## 2019-04-18 VITALS — BP 142/89 | HR 97 | Ht 62.5 in | Wt 205.2 lb

## 2019-04-18 DIAGNOSIS — Z113 Encounter for screening for infections with a predominantly sexual mode of transmission: Secondary | ICD-10-CM

## 2019-04-18 DIAGNOSIS — Z8619 Personal history of other infectious and parasitic diseases: Secondary | ICD-10-CM | POA: Insufficient documentation

## 2019-04-18 NOTE — Progress Notes (Signed)
  Subjective:     Patient ID: Erica Doyle, female   DOB: 1995-05-03, 24 y.o.   MRN: [id number]  HPI  Erica Doyle is a 24 year old black female, G0P0 in for POC of recant +trich on pap.  Review of Systems Took meds No sex since taking Reviewed past medical,surgical, social and family history. Reviewed medications and allergies.     Objective:   Physical Exam BP (!) 142/89 (BP Location: Left Arm, Patient Position: Sitting, Cuff Size: Normal)   Pulse 97   Ht 5' 2.5" (1.588 m)   Wt 205 lb 3.2 oz (93.1 kg)   LMP 03/24/2019   BMI 36.93 kg/m  Skin warm and dry.Pelvic: external genitalia is normal in appearance no lesions, vagina: scant discharge without odor,urethra has no lesions or masses noted, cervix:smooth, uterus: normal size, shape and contour, non tender, no masses felt, adnexa: no masses or tenderness noted. Bladder is non tender and no masses felt. CV obtained for POC trich.   Examination chaperoned by Stoney Bang LPN.  Assessment:     1. History of trichomoniasis CV swabs sent for trich    Plan:     Follow up prn

## 2019-04-20 LAB — CERVICOVAGINAL ANCILLARY ONLY
Comment: NEGATIVE
Trichomonas: POSITIVE — AB

## 2019-04-25 ENCOUNTER — Telehealth: Payer: Self-pay | Admitting: Adult Health

## 2019-04-25 MED ORDER — METRONIDAZOLE 500 MG PO TABS: 500.0000 mg | ORAL_TABLET | Freq: Two times a day (BID) | ORAL | 0 refills | Status: DC

## 2019-04-25 NOTE — Telephone Encounter (Signed)
Pt aware that trich +on nuswab will rx flagyl again no sex POC in about 10 days

## 2019-05-06 ENCOUNTER — Telehealth: Payer: Self-pay | Admitting: Adult Health

## 2019-05-06 NOTE — Telephone Encounter (Signed)

## 2019-05-09 ENCOUNTER — Other Ambulatory Visit (HOSPITAL_COMMUNITY)
Admission: RE | Admit: 2019-05-09 | Discharge: 2019-05-09 | Disposition: A | Discharge status: Home / Self Care | Payer: BC Managed Care – PPO | Source: Ambulatory Visit | Attending: Adult Health | Admitting: Adult Health

## 2019-05-09 ENCOUNTER — Ambulatory Visit (INDEPENDENT_AMBULATORY_CARE_PROVIDER_SITE_OTHER): Payer: BC Managed Care – PPO | Admitting: Adult Health

## 2019-05-09 ENCOUNTER — Encounter: Payer: Self-pay | Admitting: Adult Health

## 2019-05-09 ENCOUNTER — Other Ambulatory Visit: Payer: Self-pay

## 2019-05-09 VITALS — BP 141/84 | HR 84 | Ht 61.0 in | Wt 200.8 lb

## 2019-05-09 DIAGNOSIS — Z113 Encounter for screening for infections with a predominantly sexual mode of transmission: Secondary | ICD-10-CM | POA: Diagnosis not present

## 2019-05-09 DIAGNOSIS — Z8619 Personal history of other infectious and parasitic diseases: Secondary | ICD-10-CM | POA: Diagnosis not present

## 2019-05-09 NOTE — Progress Notes (Signed)
  Subjective:     Patient ID: Erica Doyle, female   DOB: Mar 30, 1995, 24 y.o.   MRN: [id number]  HPI Erica Doyle is a 24 year old black female, G0P0 in for proof of cure of trich.   Review of Systems No discharge or odor, on period No sex since treatment for trich  Reviewed past medical,surgical, social and family history. Reviewed medications and allergies.     Objective:   Physical Exam BP (!) 141/84 (BP Location: Left Arm, Patient Position: Sitting, Cuff Size: Normal)   Pulse 84   Ht 5\' 1"  (1.549 m)   Wt 200 lb 12.8 oz (91.1 kg)   LMP 05/05/2019 (Exact Date)   BMI 37.94 kg/m   Skin warm and dry.Pelvic: external genitalia is normal in appearance no lesions, vagina: +period blood without odor,urethra has no lesions or masses noted, cervix:smooth, uterus: normal size, shape and contour, non tender, no masses felt, adnexa: no masses or tenderness noted. Bladder is non tender and no masses felt.CV swab obtained.   Examination chaperoned by 07/05/2019 LPN.  Assessment:     1. History of trichomoniasis CV swab for trich and GC/CHL  2. Screening examination for STD (sexually transmitted disease) CV swab sent    Plan:     Follow up prn

## 2019-05-11 LAB — CERVICOVAGINAL ANCILLARY ONLY
Chlamydia: NEGATIVE
Comment: NEGATIVE
Comment: NEGATIVE
Comment: NORMAL
Neisseria Gonorrhea: NEGATIVE
Trichomonas: NEGATIVE

## 2019-09-01 ENCOUNTER — Ambulatory Visit: Payer: BC Managed Care – PPO | Admitting: Adult Health

## 2019-09-12 ENCOUNTER — Ambulatory Visit (INDEPENDENT_AMBULATORY_CARE_PROVIDER_SITE_OTHER): Payer: BC Managed Care – PPO | Admitting: Adult Health

## 2019-09-12 ENCOUNTER — Encounter: Payer: Self-pay | Admitting: Adult Health

## 2019-09-12 VITALS — BP 128/79 | HR 92 | Ht 61.0 in | Wt 186.0 lb

## 2019-09-12 DIAGNOSIS — Z3201 Encounter for pregnancy test, result positive: Secondary | ICD-10-CM

## 2019-09-12 DIAGNOSIS — N644 Mastodynia: Secondary | ICD-10-CM | POA: Diagnosis not present

## 2019-09-12 DIAGNOSIS — O3680X0 Pregnancy with inconclusive fetal viability, not applicable or unspecified: Secondary | ICD-10-CM

## 2019-09-12 DIAGNOSIS — Z3A01 Less than 8 weeks gestation of pregnancy: Secondary | ICD-10-CM | POA: Insufficient documentation

## 2019-09-12 LAB — POCT URINE PREGNANCY: Preg Test, Ur: POSITIVE — AB

## 2019-09-12 MED ORDER — PRENATAL PLUS IRON 29-1 MG PO TABS
ORAL_TABLET | ORAL | 12 refills | Status: DC
Start: 2019-09-12 — End: 2021-07-11

## 2019-09-12 NOTE — Patient Instructions (Signed)
First Trimester of Pregnancy The first trimester of pregnancy is from week 1 until the end of week 13 (months 1 through 3). A week after a sperm fertilizes an egg, the egg will implant on the wall of the uterus. This embryo will begin to develop into a baby. Genes from you and your partner will form the baby. The female genes will determine whether the baby will be a boy or a girl. At 6-8 weeks, the eyes and face will be formed, and the heartbeat can be seen on ultrasound. At the end of 12 weeks, all the baby's organs will be formed. Now that you are pregnant, you will want to do everything you can to have a healthy baby. Two of the most important things are to get good prenatal care and to follow your health care provider's instructions. Prenatal care is all the medical care you receive before the baby's birth. This care will help prevent, find, and treat any problems during the pregnancy and childbirth. Body changes during your first trimester Your body goes through many changes during pregnancy. The changes vary from woman to woman.  You may gain or lose a couple of pounds at first.  You may feel sick to your stomach (nauseous) and you may throw up (vomit). If the vomiting is uncontrollable, call your health care provider.  You may tire easily.  You may develop headaches that can be relieved by medicines. All medicines should be approved by your health care provider.  You may urinate more often. Painful urination may mean you have a bladder infection.  You may develop heartburn as a result of your pregnancy.  You may develop constipation because certain hormones are causing the muscles that push stool through your intestines to slow down.  You may develop hemorrhoids or swollen veins (varicose veins).  Your breasts may begin to grow larger and become tender. Your nipples may stick out more, and the tissue that surrounds them (areola) may become darker.  Your gums may bleed and may be  sensitive to brushing and flossing.  Dark spots or blotches (chloasma, mask of pregnancy) may develop on your face. This will likely fade after the baby is born.  Your menstrual periods will stop.  You may have a loss of appetite.  You may develop cravings for certain kinds of food.  You may have changes in your emotions from day to day, such as being excited to be pregnant or being concerned that something may go wrong with the pregnancy and baby.  You may have more vivid and strange dreams.  You may have changes in your hair. These can include thickening of your hair, rapid growth, and changes in texture. Some women also have hair loss during or after pregnancy, or hair that feels dry or thin. Your hair will most likely return to normal after your baby is born. What to expect at prenatal visits During a routine prenatal visit:  You will be weighed to make sure you and the baby are growing normally.  Your blood pressure will be taken.  Your abdomen will be measured to track your baby's growth.  The fetal heartbeat will be listened to between weeks 10 and 14 of your pregnancy.  Test results from any previous visits will be discussed. Your health care provider may ask you:  How you are feeling.  If you are feeling the baby move.  If you have had any abnormal symptoms, such as leaking fluid, bleeding, severe headaches, or abdominal   cramping.  If you are using any tobacco products, including cigarettes, chewing tobacco, and electronic cigarettes.  If you have any questions. Other tests that may be performed during your first trimester include:  Blood tests to find your blood type and to check for the presence of any previous infections. The tests will also be used to check for low iron levels (anemia) and protein on red blood cells (Rh antibodies). Depending on your risk factors, or if you previously had diabetes during pregnancy, you may have tests to check for high blood sugar  that affects pregnant women (gestational diabetes).  Urine tests to check for infections, diabetes, or protein in the urine.  An ultrasound to confirm the proper growth and development of the baby.  Fetal screens for spinal cord problems (spina bifida) and Down syndrome.  HIV (human immunodeficiency virus) testing. Routine prenatal testing includes screening for HIV, unless you choose not to have this test.  You may need other tests to make sure you and the baby are doing well. Follow these instructions at home: Medicines  Follow your health care provider's instructions regarding medicine use. Specific medicines may be either safe or unsafe to take during pregnancy.  Take a prenatal vitamin that contains at least 600 micrograms (mcg) of folic acid.  If you develop constipation, try taking a stool softener if your health care provider approves. Eating and drinking   Eat a balanced diet that includes fresh fruits and vegetables, whole grains, good sources of protein such as meat, eggs, or tofu, and low-fat dairy. Your health care provider will help you determine the amount of weight gain that is right for you.  Avoid raw meat and uncooked cheese. These carry germs that can cause birth defects in the baby.  Eating four or five small meals rather than three large meals a day may help relieve nausea and vomiting. If you start to feel nauseous, eating a few soda crackers can be helpful. Drinking liquids between meals, instead of during meals, also seems to help ease nausea and vomiting.  Limit foods that are high in fat and processed sugars, such as fried and sweet foods.  To prevent constipation: ? Eat foods that are high in fiber, such as fresh fruits and vegetables, whole grains, and beans. ? Drink enough fluid to keep your urine clear or pale yellow. Activity  Exercise only as directed by your health care provider. Most women can continue their usual exercise routine during  pregnancy. Try to exercise for 30 minutes at least 5 days a week. Exercising will help you: ? Control your weight. ? Stay in shape. ? Be prepared for labor and delivery.  Experiencing pain or cramping in the lower abdomen or lower back is a good sign that you should stop exercising. Check with your health care provider before continuing with normal exercises.  Try to avoid standing for long periods of time. Move your legs often if you must stand in one place for a long time.  Avoid heavy lifting.  Wear low-heeled shoes and practice good posture.  You may continue to have sex unless your health care provider tells you not to. Relieving pain and discomfort  Wear a good support bra to relieve breast tenderness.  Take warm sitz baths to soothe any pain or discomfort caused by hemorrhoids. Use hemorrhoid cream if your health care provider approves.  Rest with your legs elevated if you have leg cramps or low back pain.  If you develop varicose veins in   your legs, wear support hose. Elevate your feet for 15 minutes, 3-4 times a day. Limit salt in your diet. Prenatal care  Schedule your prenatal visits by the twelfth week of pregnancy. They are usually scheduled monthly at first, then more often in the last 2 months before delivery.  Write down your questions. Take them to your prenatal visits.  Keep all your prenatal visits as told by your health care provider. This is important. Safety  Wear your seat belt at all times when driving.  Make a list of emergency phone numbers, including numbers for family, friends, the hospital, and police and fire departments. General instructions  Ask your health care provider for a referral to a local prenatal education class. Begin classes no later than the beginning of month 6 of your pregnancy.  Ask for help if you have counseling or nutritional needs during pregnancy. Your health care provider can offer advice or refer you to specialists for help  with various needs.  Do not use hot tubs, steam rooms, or saunas.  Do not douche or use tampons or scented sanitary pads.  Do not cross your legs for long periods of time.  Avoid cat litter boxes and soil used by cats. These carry germs that can cause birth defects in the baby and possibly loss of the fetus by miscarriage or stillbirth.  Avoid all smoking, herbs, alcohol, and medicines not prescribed by your health care provider. Chemicals in these products affect the formation and growth of the baby.  Do not use any products that contain nicotine or tobacco, such as cigarettes and e-cigarettes. If you need help quitting, ask your health care provider. You may receive counseling support and other resources to help you quit.  Schedule a dentist appointment. At home, brush your teeth with a soft toothbrush and be gentle when you floss. Contact a health care provider if:  You have dizziness.  You have mild pelvic cramps, pelvic pressure, or nagging pain in the abdominal area.  You have persistent nausea, vomiting, or diarrhea.  You have a bad smelling vaginal discharge.  You have pain when you urinate.  You notice increased swelling in your face, hands, legs, or ankles.  You are exposed to fifth disease or chickenpox.  You are exposed to German measles (rubella) and have never had it. Get help right away if:  You have a fever.  You are leaking fluid from your vagina.  You have spotting or bleeding from your vagina.  You have severe abdominal cramping or pain.  You have rapid weight gain or loss.  You vomit blood or material that looks like coffee grounds.  You develop a severe headache.  You have shortness of breath.  You have any kind of trauma, such as from a fall or a car accident. Summary  The first trimester of pregnancy is from week 1 until the end of week 13 (months 1 through 3).  Your body goes through many changes during pregnancy. The changes vary from  woman to woman.  You will have routine prenatal visits. During those visits, your health care provider will examine you, discuss any test results you may have, and talk with you about how you are feeling. This information is not intended to replace advice given to you by your health care provider. Make sure you discuss any questions you have with your health care provider. Document Revised: 01/23/2017 Document Reviewed: 01/23/2016 Elsevier Patient Education  2020 Elsevier Inc.  

## 2019-09-12 NOTE — Progress Notes (Signed)
  Subjective:     Patient ID: Erica Doyle, female   DOB: Jul 24, 1995, 24 y.o.   MRN: [id number]  HPI Erica Doyle is a 24 year old black female, in complaining of breast pain for about a month, now and has missed a period.   Review of Systems +breast pain +missed period Reviewed past medical,surgical, social and family history. Reviewed medications and allergies.     Objective:   Physical Exam BP 128/79 (BP Location: Left Arm, Patient Position: Sitting, Cuff Size: Normal)   Pulse 92   Ht 5\' 1"  (1.549 m)   Wt 186 lb (84.4 kg)   LMP 08/08/2019 (Exact Date)   BMI 35.14 kg/m UPT +, about 5 weeks by LMP with EDD 05/14/20 Skin warm and dry. Neck: mid line trachea, normal thyroid, good ROM, no lymphadenopathy noted. Lungs: clear to ausculation bilaterally. Cardiovascular: regular rate and rhythm.Abdomen is soft and non tender  Breasts:no dominate palpable mass, retraction or nipple discharge,nipples tender and UOQ both breasts tender    Assessment:     1. Breast pain No masses, related to early pregnancy more than likely  2. Positive pregnancy test Will rx PNV Meds ordered this encounter  Medications  . Prenatal Vit-Iron Carbonyl-FA (PRENATAL PLUS IRON) 29-1 MG TABS    Sig: Take 1 daily    Dispense:  30 tablet    Refill:  12    Order Specific Question:   Supervising Provider    Answer:   05/16/20, LUTHER H [[id number]]    3. Less than [redacted] weeks gestation of pregnancy  4. Encounter to determine fetal viability of pregnancy, single or unspecified fetus Return in 3 weeks for dating Erica Doyle    Plan:     Review handout on First trimester and by [facility]

## 2019-10-03 ENCOUNTER — Ambulatory Visit (INDEPENDENT_AMBULATORY_CARE_PROVIDER_SITE_OTHER): Payer: BC Managed Care – PPO

## 2019-10-03 DIAGNOSIS — O3680X0 Pregnancy with inconclusive fetal viability, not applicable or unspecified: Secondary | ICD-10-CM

## 2019-10-03 NOTE — Progress Notes (Signed)
[place] 8 wks,single IUP,fhr 178 bpm,crl 16.16 mm,normal ovaries

## 2019-10-13 ENCOUNTER — Ambulatory Visit: Payer: BC Managed Care – PPO | Attending: Internal Medicine

## 2019-10-13 DIAGNOSIS — Z23 Encounter for immunization: Secondary | ICD-10-CM

## 2019-10-13 NOTE — Progress Notes (Signed)
   Covid-19 Vaccination Clinic  Name:  AKYA FIORELLO    MRN: [id number] DOB: [birth year]  10/13/2019  Ms. Ohlrich was observed post Covid-19 immunization for 15 minutes without incident. She was provided with Vaccine Information Sheet and instruction to access the V-Safe system.   Ms. Trimble was instructed to call 911 with any severe reactions post vaccine: Marland Kitchen Difficulty breathing  . Swelling of face and throat  . A fast heartbeat  . A bad rash all over body  . Dizziness and weakness   Immunizations Administered    Name Date Dose VIS Date Route   Pfizer COVID-19 Vaccine 10/13/2019 10:11 AM 0.3 mL 04/20/2018 Intramuscular   Manufacturer: [employer], [employer]   Lot: [id number]   NDC: [address]-8

## 2019-11-01 ENCOUNTER — Other Ambulatory Visit: Payer: Self-pay | Admitting: Obstetrics & Gynecology

## 2019-11-01 DIAGNOSIS — Z3682 Encounter for antenatal screening for nuchal translucency: Secondary | ICD-10-CM

## 2019-11-01 DIAGNOSIS — Z3400 Encounter for supervision of normal first pregnancy, unspecified trimester: Secondary | ICD-10-CM | POA: Insufficient documentation

## 2019-11-02 ENCOUNTER — Ambulatory Visit (INDEPENDENT_AMBULATORY_CARE_PROVIDER_SITE_OTHER): Payer: BC Managed Care – PPO | Admitting: Advanced Practice Midwife

## 2019-11-02 ENCOUNTER — Ambulatory Visit (INDEPENDENT_AMBULATORY_CARE_PROVIDER_SITE_OTHER): Payer: BC Managed Care – PPO

## 2019-11-02 ENCOUNTER — Encounter: Payer: Self-pay | Admitting: Advanced Practice Midwife

## 2019-11-02 ENCOUNTER — Ambulatory Visit: Payer: BC Managed Care – PPO | Admitting: *Deleted

## 2019-11-02 VITALS — BP 126/80 | HR 74 | Wt 184.0 lb

## 2019-11-02 DIAGNOSIS — Z31430 Encounter of female for testing for genetic disease carrier status for procreative management: Secondary | ICD-10-CM | POA: Diagnosis not present

## 2019-11-02 DIAGNOSIS — Z3682 Encounter for antenatal screening for nuchal translucency: Secondary | ICD-10-CM

## 2019-11-02 DIAGNOSIS — Z331 Pregnant state, incidental: Secondary | ICD-10-CM | POA: Diagnosis not present

## 2019-11-02 DIAGNOSIS — Z3401 Encounter for supervision of normal first pregnancy, first trimester: Secondary | ICD-10-CM | POA: Diagnosis not present

## 2019-11-02 DIAGNOSIS — Z3A12 12 weeks gestation of pregnancy: Secondary | ICD-10-CM

## 2019-11-02 DIAGNOSIS — Z1389 Encounter for screening for other disorder: Secondary | ICD-10-CM | POA: Diagnosis not present

## 2019-11-02 DIAGNOSIS — A599 Trichomoniasis, unspecified: Secondary | ICD-10-CM

## 2019-11-02 DIAGNOSIS — Z3402 Encounter for supervision of normal first pregnancy, second trimester: Secondary | ICD-10-CM

## 2019-11-02 DIAGNOSIS — Z6834 Body mass index (BMI) 34.0-34.9, adult: Secondary | ICD-10-CM

## 2019-11-02 LAB — POCT URINALYSIS DIPSTICK OB
Blood, UA: NEGATIVE
Glucose, UA: NEGATIVE
Ketones, UA: NEGATIVE
Leukocytes, UA: NEGATIVE
Nitrite, UA: NEGATIVE
POC,PROTEIN,UA: NEGATIVE

## 2019-11-02 NOTE — Progress Notes (Signed)
INITIAL OBSTETRICAL VISIT Patient name: Erica Doyle MRN [id number]  Date of birth: 01-03-1996 Chief Complaint:   Initial Prenatal Visit (nt/it)  History of Present Illness:   Erica Doyle is a 24 y.o. G52P0000 African American female at [redacted]w[redacted]d by LMP c/w u/s at 8 weeks with an Estimated Date of Delivery: 05/14/20 being seen today for her initial obstetrical visit.   Her obstetrical history is significant for primigravida.   Today she reports feeling well!.  Depression screen [facility] 2/9 11/02/2019 04/06/2019  Decreased Interest 0 0  Down, Depressed, Hopeless 0 0  PHQ - 2 Score 0 0  Altered sleeping 0 -  Tired, decreased energy 1 -  Change in appetite 0 -  Feeling bad or failure about yourself  0 -  Trouble concentrating 0 -  Moving slowly or fidgety/restless 0 -  Suicidal thoughts 0 -  PHQ-9 Score 1 -    Patient's last menstrual period was 08/08/2019 (exact date). Last pap Feb 2021. Results were: normal except for +trich (neg POC) Review of Systems:   Pertinent items are noted in HPI Denies cramping/contractions, leakage of fluid, vaginal bleeding, abnormal vaginal discharge w/ itching/odor/irritation, headaches, visual changes, shortness of breath, chest pain, abdominal pain, severe nausea/vomiting, or problems with urination or bowel movements unless otherwise stated above.  Pertinent History Reviewed:  Reviewed past medical,surgical, social, obstetrical and family history.  Reviewed problem list, medications and allergies. OB History  Gravida Para Term Preterm AB Living  1 0 0 0 0 0  SAB TAB Ectopic Multiple Live Births  0 0 0 0 0    # Outcome Date GA Lbr Len/2nd Weight Sex Delivery Anes PTL Lv  1 Current            Physical Assessment:   Vitals:   11/02/19 1121  BP: 126/80  Pulse: 74  Weight: 184 lb (83.5 kg)  Body mass index is 34.77 kg/m.       Physical Examination:  General appearance - well appearing, and in no distress  Mental status - alert, oriented to  person, place, and time  Psych:  She has a normal mood and affect  Skin - warm and dry, normal color, no suspicious lesions noted  Chest - effort normal, all lung fields clear to auscultation bilaterally  Heart - normal rate and regular rhythm  Abdomen - soft, nontender  Extremities:  No swelling or varicosities noted  Pelvic - not indicated  Thin prep pap is not done   TODAY'S NT [place] 12+2 wks,measurements c/w dates,crl 62.97 mm,normal ovaries,NB present,NT 2.1 mm,fhr 155 BPM  No results found for this or any previous visit (from the past 24 hour(s)).  Assessment & Plan:  1) Low-Risk Pregnancy G1P0000 at [redacted]w[redacted]d with an Estimated Date of Delivery: 05/14/20   2) Initial OB visit   Meds: No orders of the defined types were placed in this encounter.   Initial labs obtained Continue prenatal vitamins Reviewed n/v relief measures and warning s/s to report Reviewed recommended weight gain based on pre-gravid BMI Encouraged well-balanced diet Genetic & carrier screening discussed: requests Panorama and NT/IT, requests Horizon 14  Ultrasound discussed; fetal survey: requested CCNC completed> form faxed if has or is planning to apply for medicaid The nature of [facility] [employer] with multiple MDs and other Advanced Practice Providers was explained to patient; also emphasized that fellows, residents, and students are part of our team. Ordered home bp cuff. Check bp weekly, let us  know if >140/90.   No indications for ASA therapy (per uptodate)   Indications for early A1C (per uptodate) BMI >=25 (>=23 in Asian women) AND one of the following High-risk race/ethnicity (eg, African American, Latino, Native American, [place] American, [place] Islander) Yes   Follow-up: Return in about 6 weeks (around 12/14/2019) for [place]: Anatomy, 2nd IT, LROB, in person.   Orders Placed This Encounter  Procedures  . Urine Culture  . GC/Chlamydia Probe Amp  . US OB Comp + 14 Wk  .  Integrated 1  . Genetic Screening  . Pain Management Screening Profile (10S)  . CBC/D/Plt+RPR+Rh+ABO+Rub Ab...  . Hemoglobin A1c  . POC Urinalysis Dipstick OB    Arabella Merles CNM 11/02/2019 11:59 AM

## 2019-11-02 NOTE — Patient Instructions (Signed)
Erica Doyle, I greatly value your feedback.  If you receive a survey following your visit with [place] today, we appreciate you taking the time to fill it out.  Thanks, Philipp Deputy CNM   [facility] ([address] [place], [place] [address]) Entrance C, located off of [address] Free 24/7 valet parking   Nausea & Vomiting  Have saltine crackers or pretzels by your bed and eat a few bites before you raise your head out of bed in the morning  Eat small frequent meals throughout the day instead of large meals  Drink plenty of fluids throughout the day to stay hydrated, just don't drink a lot of fluids with your meals.  This can make your stomach fill up faster making you feel sick  Do not brush your teeth right after you eat  Products with real ginger are good for nausea, like ginger ale and ginger hard candy Make sure it says made with real ginger!  Sucking on sour candy like lemon heads is also good for nausea  If your prenatal vitamins make you nauseated, take them at night so you will sleep through the nausea  Sea Bands  If you feel like you need medicine for the nausea & vomiting please let us know  If you are unable to keep any fluids or food down please let us know   Constipation  Drink plenty of fluid, preferably water, throughout the day  Eat foods high in fiber such as fruits, vegetables, and grains  Exercise, such as walking, is a good way to keep your bowels regular  Drink warm fluids, especially warm prune juice, or decaf coffee  Eat a 1/2 cup of real oatmeal (not instant), 1/2 cup applesauce, and 1/2-1 cup warm prune juice every day  If needed, you may take Colace (docusate sodium) stool softener once or twice a day to help keep the stool soft.   If you still are having problems with constipation, you may take Miralax once daily as needed to help keep your bowels regular.   Home Blood Pressure Monitoring for Patients   Your provider  has recommended that you check your blood pressure (BP) at least once a week at home. If you do not have a blood pressure cuff at home, one will be provided for you. Contact your provider if you have not received your monitor within 1 week.   Helpful Tips for Accurate Home Blood Pressure Checks  . Don't smoke, exercise, or drink caffeine 30 minutes before checking your BP . Use the restroom before checking your BP (a full bladder can raise your pressure) . Relax in a comfortable upright chair . Feet on the ground . Left arm resting comfortably on a flat surface at the level of your heart . Legs uncrossed . Back supported . Sit quietly and don't talk . Place the cuff on your bare arm . Adjust snuggly, so that only two fingertips can fit between your skin and the top of the cuff . Check 2 readings separated by at least one minute . Keep a log of your BP readings . For a visual, please reference this diagram: http://ccnc.care/bpdiagram  Provider Name: [facility]     Phone: [phone]  Zone 1: ALL CLEAR  Continue to monitor your symptoms:  . BP reading is less than 140 (top number) or less than 90 (bottom number)  . No right upper stomach pain . No headaches or seeing  spots . No feeling nauseated or throwing up . No swelling in face and hands  Zone 2: CAUTION Call your doctor's office for any of the following:  . BP reading is greater than 140 (top number) or greater than 90 (bottom number)  . Stomach pain under your ribs in the middle or right side . Headaches or seeing spots . Feeling nauseated or throwing up . Swelling in face and hands  Zone 3: EMERGENCY  Seek immediate medical care if you have any of the following:  . BP reading is greater than160 (top number) or greater than 110 (bottom number) . Severe headaches not improving with Tylenol . Serious difficulty catching your breath . Any worsening symptoms from Zone 2    First Trimester of Pregnancy The first  trimester of pregnancy is from week 1 until the end of week 12 (months 1 through 3). A week after a sperm fertilizes an egg, the egg will implant on the wall of the uterus. This embryo will begin to develop into a baby. Genes from you and your partner are forming the baby. The female genes determine whether the baby is a boy or a girl. At 6-8 weeks, the eyes and face are formed, and the heartbeat can be seen on ultrasound. At the end of 12 weeks, all the baby's organs are formed.  Now that you are pregnant, you will want to do everything you can to have a healthy baby. Two of the most important things are to get good prenatal care and to follow your health care provider's instructions. Prenatal care is all the medical care you receive before the baby's birth. This care will help prevent, find, and treat any problems during the pregnancy and childbirth. BODY CHANGES Your body goes through many changes during pregnancy. The changes vary from woman to woman.   You may gain or lose a couple of pounds at first.  You may feel sick to your stomach (nauseous) and throw up (vomit). If the vomiting is uncontrollable, call your health care provider.  You may tire easily.  You may develop headaches that can be relieved by medicines approved by your health care provider.  You may urinate more often. Painful urination may mean you have a bladder infection.  You may develop heartburn as a result of your pregnancy.  You may develop constipation because certain hormones are causing the muscles that push waste through your intestines to slow down.  You may develop hemorrhoids or swollen, bulging veins (varicose veins).  Your breasts may begin to grow larger and become tender. Your nipples may stick out more, and the tissue that surrounds them (areola) may become darker.  Your gums may bleed and may be sensitive to brushing and flossing.  Dark spots or blotches (chloasma, mask of pregnancy) may develop on your  face. This will likely fade after the baby is born.  Your menstrual periods will stop.  You may have a loss of appetite.  You may develop cravings for certain kinds of food.  You may have changes in your emotions from day to day, such as being excited to be pregnant or being concerned that something may go wrong with the pregnancy and baby.  You may have more vivid and strange dreams.  You may have changes in your hair. These can include thickening of your hair, rapid growth, and changes in texture. Some women also have hair loss during or after pregnancy, or hair that feels dry or thin. Your hair  will most likely return to normal after your baby is born. WHAT TO EXPECT AT YOUR PRENATAL VISITS During a routine prenatal visit:  You will be weighed to make sure you and the baby are growing normally.  Your blood pressure will be taken.  Your abdomen will be measured to track your baby's growth.  The fetal heartbeat will be listened to starting around week 10 or 12 of your pregnancy.  Test results from any previous visits will be discussed. Your health care provider may ask you:  How you are feeling.  If you are feeling the baby move.  If you have had any abnormal symptoms, such as leaking fluid, bleeding, severe headaches, or abdominal cramping.  If you have any questions. Other tests that may be performed during your first trimester include:  Blood tests to find your blood type and to check for the presence of any previous infections. They will also be used to check for low iron levels (anemia) and Rh antibodies. Later in the pregnancy, blood tests for diabetes will be done along with other tests if problems develop.  Urine tests to check for infections, diabetes, or protein in the urine.  An ultrasound to confirm the proper growth and development of the baby.  An amniocentesis to check for possible genetic problems.  Fetal screens for spina bifida and Down syndrome.  You  may need other tests to make sure you and the baby are doing well. HOME CARE INSTRUCTIONS  Medicines  Follow your health care provider's instructions regarding medicine use. Specific medicines may be either safe or unsafe to take during pregnancy.  Take your prenatal vitamins as directed.  If you develop constipation, try taking a stool softener if your health care provider approves. Diet  Eat regular, well-balanced meals. Choose a variety of foods, such as meat or vegetable-based protein, fish, milk and low-fat dairy products, vegetables, fruits, and whole grain breads and cereals. Your health care provider will help you determine the amount of weight gain that is right for you.  Avoid raw meat and uncooked cheese. These carry germs that can cause birth defects in the baby.  Eating four or five small meals rather than three large meals a day may help relieve nausea and vomiting. If you start to feel nauseous, eating a few soda crackers can be helpful. Drinking liquids between meals instead of during meals also seems to help nausea and vomiting.  If you develop constipation, eat more high-fiber foods, such as fresh vegetables or fruit and whole grains. Drink enough fluids to keep your urine clear or pale yellow. Activity and Exercise  Exercise only as directed by your health care provider. Exercising will help you:  Control your weight.  Stay in shape.  Be prepared for labor and delivery.  Experiencing pain or cramping in the lower abdomen or low back is a good sign that you should stop exercising. Check with your health care provider before continuing normal exercises.  Try to avoid standing for long periods of time. Move your legs often if you must stand in one place for a long time.  Avoid heavy lifting.  Wear low-heeled shoes, and practice good posture.  You may continue to have sex unless your health care provider directs you otherwise. Relief of Pain or Discomfort  Wear a  good support bra for breast tenderness.    Take warm sitz baths to soothe any pain or discomfort caused by hemorrhoids. Use hemorrhoid cream if your health care provider approves.  Rest with your legs elevated if you have leg cramps or low back pain.  If you develop varicose veins in your legs, wear support hose. Elevate your feet for 15 minutes, 3-4 times a day. Limit salt in your diet. Prenatal Care  Schedule your prenatal visits by the twelfth week of pregnancy. They are usually scheduled monthly at first, then more often in the last 2 months before delivery.  Write down your questions. Take them to your prenatal visits.  Keep all your prenatal visits as directed by your health care provider. Safety  Wear your seat belt at all times when driving.  Make a list of emergency phone numbers, including numbers for family, friends, the hospital, and police and fire departments. General Tips  Ask your health care provider for a referral to a local prenatal education class. Begin classes no later than at the beginning of month 6 of your pregnancy.  Ask for help if you have counseling or nutritional needs during pregnancy. Your health care provider can offer advice or refer you to specialists for help with various needs.  Do not use hot tubs, steam rooms, or saunas.  Do not douche or use tampons or scented sanitary pads.  Do not cross your legs for long periods of time.  Avoid cat litter boxes and soil used by cats. These carry germs that can cause birth defects in the baby and possibly loss of the fetus by miscarriage or stillbirth.  Avoid all smoking, herbs, alcohol, and medicines not prescribed by your health care provider. Chemicals in these affect the formation and growth of the baby.  Schedule a dentist appointment. At home, brush your teeth with a soft toothbrush and be gentle when you floss. SEEK MEDICAL CARE IF:   You have dizziness.  You have mild pelvic cramps, pelvic  pressure, or nagging pain in the abdominal area.  You have persistent nausea, vomiting, or diarrhea.  You have a bad smelling vaginal discharge.  You have pain with urination.  You notice increased swelling in your face, hands, legs, or ankles. SEEK IMMEDIATE MEDICAL CARE IF:   You have a fever.  You are leaking fluid from your vagina.  You have spotting or bleeding from your vagina.  You have severe abdominal cramping or pain.  You have rapid weight gain or loss.  You vomit blood or material that looks like coffee grounds.  You are exposed to [place] measles and have never had them.  You are exposed to fifth disease or chickenpox.  You develop a severe headache.  You have shortness of breath.  You have any kind of trauma, such as from a fall or a car accident. Document Released: 02/04/2001 Document Revised: 06/27/2013 Document Reviewed: 12/21/2012 [facility] Patient Information 2015 [place], [place]. This information is not intended to replace advice given to you by your health care provider. Make sure you discuss any questions you have with your health care provider.

## 2019-11-02 NOTE — Progress Notes (Signed)
[place] 12+2 wks,measurements c/w dates,crl 62.97 mm,normal ovaries,NB present,NT 2.1 mm,fhr 155 BPM

## 2019-11-03 ENCOUNTER — Ambulatory Visit: Payer: BC Managed Care – PPO | Attending: Internal Medicine

## 2019-11-03 DIAGNOSIS — Z23 Encounter for immunization: Secondary | ICD-10-CM

## 2019-11-03 LAB — PMP SCREEN PROFILE (10S), URINE
Amphetamine Scrn, Ur: NEGATIVE ng/mL
BARBITURATE SCREEN URINE: NEGATIVE ng/mL
BENZODIAZEPINE SCREEN, URINE: NEGATIVE ng/mL
CANNABINOIDS UR QL SCN: POSITIVE ng/mL — AB
Cocaine (Metab) Scrn, Ur: NEGATIVE ng/mL
Creatinine(Crt), U: 129.2 mg/dL (ref 20.0–300.0)
Methadone Screen, Urine: NEGATIVE ng/mL
OXYCODONE+OXYMORPHONE UR QL SCN: NEGATIVE ng/mL
Opiate Scrn, Ur: NEGATIVE ng/mL
Ph of Urine: 7.5 (ref 4.5–8.9)
Phencyclidine Qn, Ur: NEGATIVE ng/mL
Propoxyphene Scrn, Ur: NEGATIVE ng/mL

## 2019-11-03 LAB — GC/CHLAMYDIA PROBE AMP
Chlamydia trachomatis, NAA: NEGATIVE
Neisseria Gonorrhoeae by PCR: NEGATIVE

## 2019-11-03 LAB — HEMOGLOBIN A1C
Est. average glucose Bld gHb Est-mCnc: 103 mg/dL
Hgb A1c MFr Bld: 5.2 % (ref 4.8–5.6)

## 2019-11-03 NOTE — Progress Notes (Signed)
   Covid-19 Vaccination Clinic  Name:  Erica Doyle    MRN: [id number] DOB: Jun 04, 1995  11/03/2019  [name] was observed post Covid-19 immunization for 30 minutes based on pre-vaccination screening without incident. She was provided with Vaccine Information Sheet and instruction to access the V-Safe system.   [name] was instructed to call 911 with any severe reactions post vaccine: Marland Kitchen Difficulty breathing  . Swelling of face and throat  . A fast heartbeat  . A bad rash all over body  . Dizziness and weakness   Immunizations Administered    Name Date Dose VIS Date Route   Pfizer COVID-19 Vaccine 11/03/2019  9:32 AM 0.3 mL 04/20/2018 Intramuscular   Manufacturer: [employer], [employer]   Lot: [id number]   NDC: [address]-3

## 2019-11-04 LAB — CBC/D/PLT+RPR+RH+ABO+RUB AB...
Antibody Screen: NEGATIVE
Basophils Absolute: 0 10*3/uL (ref 0.0–0.2)
Basos: 0 %
EOS (ABSOLUTE): 0.2 10*3/uL (ref 0.0–0.4)
Eos: 2 %
HCV Ab: <0.1 s/co ratio (ref 0.0–0.9)
HIV Screen 4th Generation wRfx: NONREACTIVE
Hematocrit: 37.4 % (ref 34.0–46.6)
Hemoglobin: 12 g/dL (ref 11.1–15.9)
Hepatitis B Surface Ag: NEGATIVE
Immature Grans (Abs): 0.1 10*3/uL (ref 0.0–0.1)
Immature Granulocytes: 1 %
Lymphocytes Absolute: 2.6 10*3/uL (ref 0.7–3.1)
Lymphs: 21 %
MCH: 28.1 pg (ref 26.6–33.0)
MCHC: 32.1 g/dL (ref 31.5–35.7)
MCV: 88 fL (ref 79–97)
Monocytes Absolute: 0.8 10*3/uL (ref 0.1–0.9)
Monocytes: 7 %
Neutrophils Absolute: 8.4 10*3/uL — ABNORMAL HIGH (ref 1.4–7.0)
Neutrophils: 69 %
Platelets: 346 10*3/uL (ref 150–450)
RBC: 4.27 x10E6/uL (ref 3.77–5.28)
RDW: 13.6 % (ref 11.7–15.4)
RPR Ser Ql: NONREACTIVE
Rh Factor: POSITIVE
Rubella Antibodies, IGG: 6.16 index (ref >0.99)
WBC: 12.2 10*3/uL — ABNORMAL HIGH (ref 3.4–10.8)

## 2019-11-04 LAB — INTEGRATED 1
Crown Rump Length: 63 mm
Gest. Age on Collection Date: 12.6 weeks
Maternal Age at EDD: 23.5 yr
Nuchal Translucency (NT): 2.1 mm
Number of Fetuses: 1
PAPP-A Value: 929.4 ng/mL
Weight: 184 [lb_av]

## 2019-11-04 LAB — URINE CULTURE

## 2019-11-04 LAB — HCV INTERPRETATION

## 2019-11-07 ENCOUNTER — Encounter: Payer: Self-pay | Admitting: Women's Health

## 2019-11-07 DIAGNOSIS — F1290 Cannabis use, unspecified, uncomplicated: Secondary | ICD-10-CM | POA: Insufficient documentation

## 2019-12-14 ENCOUNTER — Other Ambulatory Visit: Payer: Self-pay | Admitting: Advanced Practice Midwife

## 2019-12-14 DIAGNOSIS — Z363 Encounter for antenatal screening for malformations: Secondary | ICD-10-CM

## 2019-12-14 DIAGNOSIS — Z3401 Encounter for supervision of normal first pregnancy, first trimester: Secondary | ICD-10-CM

## 2019-12-15 ENCOUNTER — Other Ambulatory Visit: Payer: Self-pay

## 2019-12-15 ENCOUNTER — Ambulatory Visit (INDEPENDENT_AMBULATORY_CARE_PROVIDER_SITE_OTHER): Payer: BC Managed Care – PPO | Admitting: Advanced Practice Midwife

## 2019-12-15 ENCOUNTER — Ambulatory Visit (INDEPENDENT_AMBULATORY_CARE_PROVIDER_SITE_OTHER): Payer: Medicaid Other

## 2019-12-15 VITALS — BP 129/80 | HR 94 | Wt 186.0 lb

## 2019-12-15 DIAGNOSIS — Z3A18 18 weeks gestation of pregnancy: Secondary | ICD-10-CM

## 2019-12-15 DIAGNOSIS — Z363 Encounter for antenatal screening for malformations: Secondary | ICD-10-CM

## 2019-12-15 DIAGNOSIS — Z331 Pregnant state, incidental: Secondary | ICD-10-CM

## 2019-12-15 DIAGNOSIS — Z3401 Encounter for supervision of normal first pregnancy, first trimester: Secondary | ICD-10-CM | POA: Diagnosis not present

## 2019-12-15 DIAGNOSIS — Z1389 Encounter for screening for other disorder: Secondary | ICD-10-CM

## 2019-12-15 DIAGNOSIS — Z3402 Encounter for supervision of normal first pregnancy, second trimester: Secondary | ICD-10-CM

## 2019-12-15 DIAGNOSIS — Z1379 Encounter for other screening for genetic and chromosomal anomalies: Secondary | ICD-10-CM

## 2019-12-15 LAB — POCT URINALYSIS DIPSTICK OB
Blood, UA: NEGATIVE
Glucose, UA: NEGATIVE
Ketones, UA: NEGATIVE
Leukocytes, UA: NEGATIVE
Nitrite, UA: NEGATIVE
POC,PROTEIN,UA: NEGATIVE

## 2019-12-15 NOTE — Patient Instructions (Signed)
Meet the Provider Zoom Sessions      [facility] is now offering FREE monthly 1-hour virtual Zoom sessions for new, current, and prospective patients.        During these sessions, you can:   Learn about our practice, model of care, services   Get answers to questions about pregnancy and birth during COVID   Pick your provider's brain about anything else!    Sessions will be hosted by [facility] Nurse Practitioners, Physician Assistants, Physicians and Midwives          No registration required      2021 Dates:      All at 6pm     October 21st     November 18th   December 16th     January 20th  February 17th    To join one of these meetings, a few minutes before it is set to start:     Copy/paste the link into your web browser:  [email or web address]    OR  Scan the QR code below (open up your camera and point towards QR code; click on tab that pops up on your phone ("zoom")     

## 2019-12-15 NOTE — Progress Notes (Addendum)
[place] 18+3 wks,cephalic,posterior placenta gr 0,normal left ovary,right ovary not visualized,cx 3.2 cm,svp of fluid 4.6 cm,fhr 152 bpm,EFW 218 g 20%,anatomy complete,no obvious abnormalities

## 2019-12-15 NOTE — Progress Notes (Signed)
   LOW-RISK PREGNANCY VISIT Patient name: Erica Doyle MRN [id number]  Date of birth: March 09, 1995 Chief Complaint:   Routine Prenatal Visit  History of Present Illness:   Erica Doyle is a 24 y.o. G73P0000 female at [redacted]w[redacted]d with an Estimated Date of Delivery: 05/14/20 being seen today for ongoing management of a low-risk pregnancy.  Today she reports no complaints. Contractions: Not present. Vag. Bleeding: None.  Movement: Present. denies leaking of fluid. Review of Systems:   Pertinent items are noted in HPI Denies abnormal vaginal discharge w/ itching/odor/irritation, headaches, visual changes, shortness of breath, chest pain, abdominal pain, severe nausea/vomiting, or problems with urination or bowel movements unless otherwise stated above. Pertinent History Reviewed:  Reviewed past medical,surgical, social, obstetrical and family history.  Reviewed problem list, medications and allergies. Physical Assessment:   Vitals:   12/15/19 1046  BP: 129/80  Pulse: 94  Weight: 186 lb (84.4 kg)  Body mass index is 35.14 kg/m.        Physical Examination:   General appearance: Well appearing, and in no distress  Mental status: Alert, oriented to person, place, and time  Skin: Warm & dry  Cardiovascular: Normal heart rate noted  Respiratory: Normal respiratory effort, no distress  Abdomen: Soft, gravid, nontender  Pelvic: Cervical exam deferred         Extremities: Edema: None  Fetal Status: Fetal Heart Rate (bpm): [place]   Movement: Present   [place] 18+3 wks,cephalic,anterior placenta gr 0,posterior placenta gr 0,normal left ovary,right ovary not visualized,cx 3.2 cm,svp of fluid 4.6 cm,fhr 152 bpm,EFW 218 g 20%,anatomy complete,no obvious abnormalities  Chaperone: n/a    Results for orders placed or performed in visit on 12/15/19 (from the past 24 hour(s))  POC Urinalysis Dipstick OB   Collection Time: 12/15/19 10:47 AM  Result Value Ref Range   Color, UA     Clarity, UA     Glucose, UA  Negative Negative   Bilirubin, UA     Ketones, UA neg    Spec Grav, UA     Blood, UA neg    pH, UA     POC,PROTEIN,UA Negative Negative, Trace, Small (1+), Moderate (2+), Large (3+), 4+   Urobilinogen, UA     Nitrite, UA neg    Leukocytes, UA Negative Negative   Appearance     Odor      Assessment & Plan:  1) Low-risk pregnancy G1P0000 at [redacted]w[redacted]d with an Estimated Date of Delivery: 05/14/20      Meds: No orders of the defined types were placed in this encounter.  Labs/procedures today: anatomy scan, 2nd IT  Plan:  Continue routine obstetrical care  Next visit: prefers in person    Reviewed: Preterm labor symptoms and general obstetric precautions including but not limited to vaginal bleeding, contractions, leaking of fluid and fetal movement were reviewed in detail with the patient.  All questions were answered.  Follow-up: Return in about 4 weeks (around 01/12/2020) for LROB.  Orders Placed This Encounter  Procedures  . INTEGRATED 2  . POC Urinalysis Dipstick OB   Jacklyn Shell DNP, CNM 12/15/2019 11:00 AM

## 2019-12-17 LAB — INTEGRATED 2
AFP MoM: 1.09
Alpha-Fetoprotein: 47.2 ng/mL
Crown Rump Length: 63 mm
DIA MoM: 0.96
DIA Value: 139 pg/mL
Estriol, Unconjugated: 1.98 ng/mL
Gest. Age on Collection Date: 12.6 weeks
Gestational Age: 18.7 weeks
Maternal Age at EDD: 23.5 yr
Nuchal Translucency (NT): 2.1 mm
Nuchal Translucency MoM: 1.49
Number of Fetuses: 1
PAPP-A MoM: 1.16
PAPP-A Value: 929.4 ng/mL
Test Results:: NEGATIVE
Weight: 184 [lb_av]
Weight: 184 [lb_av]
hCG MoM: 1.32
hCG Value: 28.1 IU/mL
uE3 MoM: 1.24

## 2020-01-11 ENCOUNTER — Other Ambulatory Visit: Payer: Self-pay

## 2020-01-11 ENCOUNTER — Encounter: Payer: Self-pay | Admitting: Advanced Practice Midwife

## 2020-01-11 ENCOUNTER — Ambulatory Visit (INDEPENDENT_AMBULATORY_CARE_PROVIDER_SITE_OTHER): Payer: Medicaid Other | Admitting: Advanced Practice Midwife

## 2020-01-11 VITALS — BP 129/74 | HR 95 | Wt 189.0 lb

## 2020-01-11 DIAGNOSIS — Z1389 Encounter for screening for other disorder: Secondary | ICD-10-CM

## 2020-01-11 DIAGNOSIS — Z331 Pregnant state, incidental: Secondary | ICD-10-CM

## 2020-01-11 DIAGNOSIS — Z23 Encounter for immunization: Secondary | ICD-10-CM

## 2020-01-11 DIAGNOSIS — Z3402 Encounter for supervision of normal first pregnancy, second trimester: Secondary | ICD-10-CM

## 2020-01-11 DIAGNOSIS — Z3A22 22 weeks gestation of pregnancy: Secondary | ICD-10-CM

## 2020-01-11 LAB — POCT URINALYSIS DIPSTICK OB
Blood, UA: NEGATIVE
Glucose, UA: NEGATIVE
Ketones, UA: NEGATIVE
Leukocytes, UA: NEGATIVE
Nitrite, UA: NEGATIVE
POC,PROTEIN,UA: NEGATIVE

## 2020-01-11 NOTE — Patient Instructions (Signed)
Erica Doyle, I greatly value your feedback.  If you receive a survey following your visit with [place] today, we appreciate you taking the time to fill it out.  Thanks, Philipp Deputy, CNM   You will have your sugar test next visit.  Please do not eat or drink anything after midnight the night before you come, not even water.  You will be here for at least two hours.  Please make an appointment online for the bloodwork at [email or web address] for 8:30am (or as close to this as possible). Make sure you select the [facility] service center. The day of the appointment, check in with our office first, then you will go to [facility] to start the sugar test.    [facility] HAS MOVED!!! It is now [facility] ([address] [place], [place] [address]) Entrance C, located off of [address] valet parking  Go to [email or web address] to register for FREE online childbirth classes   Call the office [phone]) or go to [facility] if:  You begin to have strong, frequent contractions  Your water breaks.  Sometimes it is a big gush of fluid, sometimes it is just a trickle that keeps getting your panties wet or running down your legs  You have vaginal bleeding.  It is normal to have a small amount of spotting if your cervix was checked.   You don't feel your baby moving like normal.  If you don't, get you something to eat and drink and lay down and focus on feeling your baby move.   If your baby is still not moving like normal, you should call the office or go to [facility].  [place] Pediatricians/Family Doctors:  [facility] [phone]            [facility] [phone]                 [facility] [phone] (usually not accepting new patients unless you have family there already, you are always welcome to call and ask)       [facility] Department [phone]       [facility] Pediatricians/Family Doctors:    [facility]: [phone]  [facility]: [phone]  [facility]: [phone]  [facility] Doctors:   [facility]: [phone]   [facility]: [phone]  [facility] Doctors:  [facility]: [phone]   Home Blood Pressure Monitoring for Patients   Your provider has recommended that you check your blood pressure (BP) at least once a week at home. If you do not have a blood pressure cuff at home, one will be provided for you. Contact your provider if you have not received your monitor within 1 week.   Helpful Tips for Accurate Home Blood Pressure Checks  . Don't smoke, exercise, or drink caffeine 30 minutes before checking your BP . Use the restroom before checking your BP (a full bladder can raise your pressure) . Relax in a comfortable upright chair . Feet on the ground . Left arm resting comfortably on a flat surface at the level of your heart . Legs uncrossed . Back supported . Sit quietly and don't talk . Place the cuff on your bare arm . Adjust snuggly, so that only two fingertips can fit between your skin and the top of the cuff . Check 2 readings separated by at least one minute . Keep a log of your BP  readings . For a visual, please reference this diagram: http://ccnc.care/bpdiagram  Provider Name: [facility]     Phone: [phone]  Zone 1: ALL CLEAR  Continue to monitor your symptoms:  . BP reading is less than 140 (top number) or less than 90 (bottom number)  . No right upper stomach pain . No headaches or seeing spots . No feeling nauseated or throwing up . No swelling in face and hands  Zone 2: CAUTION Call your doctor's office for any of the following:  . BP reading is greater than 140 (top number) or greater than 90 (bottom number)  . Stomach pain under your ribs in the middle or right side . Headaches or seeing spots . Feeling  nauseated or throwing up . Swelling in face and hands  Zone 3: EMERGENCY  Seek immediate medical care if you have any of the following:  . BP reading is greater than160 (top number) or greater than 110 (bottom number) . Severe headaches not improving with Tylenol . Serious difficulty catching your breath . Any worsening symptoms from Zone 2   Second Trimester of Pregnancy The second trimester is from week 13 through week 28, months 4 through 6. The second trimester is often a time when you feel your best. Your body has also adjusted to being pregnant, and you begin to feel better physically. Usually, morning sickness has lessened or quit completely, you may have more energy, and you may have an increase in appetite. The second trimester is also a time when the fetus is growing rapidly. At the end of the sixth month, the fetus is about 9 inches long and weighs about 1 pounds. You will likely begin to feel the baby move (quickening) between 18 and 20 weeks of the pregnancy. BODY CHANGES Your body goes through many changes during pregnancy. The changes vary from woman to woman.   Your weight will continue to increase. You will notice your lower abdomen bulging out.  You may begin to get stretch marks on your hips, abdomen, and breasts.  You may develop headaches that can be relieved by medicines approved by your health care provider.  You may urinate more often because the fetus is pressing on your bladder.  You may develop or continue to have heartburn as a result of your pregnancy.  You may develop constipation because certain hormones are causing the muscles that push waste through your intestines to slow down.  You may develop hemorrhoids or swollen, bulging veins (varicose veins).  You may have back pain because of the weight gain and pregnancy hormones relaxing your joints between the bones in your pelvis and as a result of a shift in weight and the muscles that support your  balance.  Your breasts will continue to grow and be tender.  Your gums may bleed and may be sensitive to brushing and flossing.  Dark spots or blotches (chloasma, mask of pregnancy) may develop on your face. This will likely fade after the baby is born.  A dark line from your belly button to the pubic area (linea nigra) may appear. This will likely fade after the baby is born.  You may have changes in your hair. These can include thickening of your hair, rapid growth, and changes in texture. Some women also have hair loss during or after pregnancy, or hair that feels dry or thin. Your hair will most likely return to normal after your baby is born. WHAT TO EXPECT AT YOUR PRENATAL VISITS During  a routine prenatal visit:  You will be weighed to make sure you and the fetus are growing normally.  Your blood pressure will be taken.  Your abdomen will be measured to track your baby's growth.  The fetal heartbeat will be listened to.  Any test results from the previous visit will be discussed. Your health care provider may ask you:  How you are feeling.  If you are feeling the baby move.  If you have had any abnormal symptoms, such as leaking fluid, bleeding, severe headaches, or abdominal cramping.  If you have any questions. Other tests that may be performed during your second trimester include:  Blood tests that check for:  Low iron levels (anemia).  Gestational diabetes (between 24 and 28 weeks).  Rh antibodies.  Urine tests to check for infections, diabetes, or protein in the urine.  An ultrasound to confirm the proper growth and development of the baby.  An amniocentesis to check for possible genetic problems.  Fetal screens for spina bifida and Down syndrome. HOME CARE INSTRUCTIONS   Avoid all smoking, herbs, alcohol, and unprescribed drugs. These chemicals affect the formation and growth of the baby.  Follow your health care provider's instructions regarding  medicine use. There are medicines that are either safe or unsafe to take during pregnancy.  Exercise only as directed by your health care provider. Experiencing uterine cramps is a good sign to stop exercising.  Continue to eat regular, healthy meals.  Wear a good support bra for breast tenderness.  Do not use hot tubs, steam rooms, or saunas.  Wear your seat belt at all times when driving.  Avoid raw meat, uncooked cheese, cat litter boxes, and soil used by cats. These carry germs that can cause birth defects in the baby.  Take your prenatal vitamins.  Try taking a stool softener (if your health care provider approves) if you develop constipation. Eat more high-fiber foods, such as fresh vegetables or fruit and whole grains. Drink plenty of fluids to keep your urine clear or pale yellow.  Take warm sitz baths to soothe any pain or discomfort caused by hemorrhoids. Use hemorrhoid cream if your health care provider approves.  If you develop varicose veins, wear support hose. Elevate your feet for 15 minutes, 3-4 times a day. Limit salt in your diet.  Avoid heavy lifting, wear low heel shoes, and practice good posture.  Rest with your legs elevated if you have leg cramps or low back pain.  Visit your dentist if you have not gone yet during your pregnancy. Use a soft toothbrush to brush your teeth and be gentle when you floss.  A sexual relationship may be continued unless your health care provider directs you otherwise.  Continue to go to all your prenatal visits as directed by your health care provider. SEEK MEDICAL CARE IF:   You have dizziness.  You have mild pelvic cramps, pelvic pressure, or nagging pain in the abdominal area.  You have persistent nausea, vomiting, or diarrhea.  You have a bad smelling vaginal discharge.  You have pain with urination. SEEK IMMEDIATE MEDICAL CARE IF:   You have a fever.  You are leaking fluid from your vagina.  You have spotting or  bleeding from your vagina.  You have severe abdominal cramping or pain.  You have rapid weight gain or loss.  You have shortness of breath with chest pain.  You notice sudden or extreme swelling of your face, hands, ankles, feet, or legs.  You  have not felt your baby move in over an hour.  You have severe headaches that do not go away with medicine.  You have vision changes. Document Released: 02/04/2001 Document Revised: 02/15/2013 Document Reviewed: 04/13/2012 [facility] Patient Information 2015 [place], [place]. This information is not intended to replace advice given to you by your health care provider. Make sure you discuss any questions you have with your health care provider.

## 2020-01-11 NOTE — Progress Notes (Signed)
   LOW-RISK PREGNANCY VISIT Patient name: Erica Doyle MRN [id number]  Date of birth: 1995/05/23 Chief Complaint:   Routine Prenatal Visit  History of Present Illness:   Erica Doyle is a 24 y.o. G66P0000 female at [redacted]w[redacted]d with an Estimated Date of Delivery: 05/14/20 being seen today for ongoing management of a low-risk pregnancy.  Today she reports no complaints. Contractions: Not present. Vag. Bleeding: None.  Movement: Present. denies leaking of fluid. Review of Systems:   Pertinent items are noted in HPI Denies abnormal vaginal discharge w/ itching/odor/irritation, headaches, visual changes, shortness of breath, chest pain, abdominal pain, severe nausea/vomiting, or problems with urination or bowel movements unless otherwise stated above. Pertinent History Reviewed:  Reviewed past medical,surgical, social, obstetrical and family history.  Reviewed problem list, medications and allergies. Physical Assessment:   Vitals:   01/11/20 1012  BP: 129/74  Pulse: 95  Weight: 189 lb (85.7 kg)  Body mass index is 35.71 kg/m.        Physical Examination:   General appearance: Well appearing, and in no distress  Mental status: Alert, oriented to person, place, and time  Skin: Warm & dry  Cardiovascular: Normal heart rate noted  Respiratory: Normal respiratory effort, no distress  Abdomen: Soft, gravid, nontender  Pelvic: Cervical exam deferred         Extremities: Edema: None  Fetal Status: Fetal Heart Rate (bpm): 152 Fundal Height: 22 cm Movement: Present    Results for orders placed or performed in visit on 01/11/20 (from the past 24 hour(s))  POC Urinalysis Dipstick OB   Collection Time: 01/11/20 10:12 AM  Result Value Ref Range   Color, UA     Clarity, UA     Glucose, UA Negative Negative   Bilirubin, UA     Ketones, UA neg    Spec Grav, UA     Blood, UA neg    pH, UA     POC,PROTEIN,UA Negative Negative, Trace, Small (1+), Moderate (2+), Large (3+), 4+   Urobilinogen, UA      Nitrite, UA neg    Leukocytes, UA Negative Negative   Appearance     Odor      Assessment & Plan:  1) Low-risk pregnancy G1P0000 at [redacted]w[redacted]d with an Estimated Date of Delivery: 05/14/20    Meds: No orders of the defined types were placed in this encounter.  Labs/procedures today: flu vax  Plan:  Continue routine obstetrical care   Reviewed: Preterm labor symptoms and general obstetric precautions including but not limited to vaginal bleeding, contractions, leaking of fluid and fetal movement were reviewed in detail with the patient.  All questions were answered. Didn't ask about home bp cuff. Check bp weekly, let us know if >140/90.   Follow-up: Return in about 4 weeks (around 02/08/2020) for PN2, LROB, in person.  Orders Placed This Encounter  Procedures  . Flu Vaccine QUAD 36+ mos IM  . POC Urinalysis Dipstick OB   Arabella Merles [facility] 01/11/2020 10:39 AM

## 2020-02-08 ENCOUNTER — Other Ambulatory Visit: Payer: Medicaid Other

## 2020-02-08 ENCOUNTER — Other Ambulatory Visit: Payer: Self-pay

## 2020-02-08 ENCOUNTER — Ambulatory Visit (INDEPENDENT_AMBULATORY_CARE_PROVIDER_SITE_OTHER): Payer: Medicaid Other | Admitting: Advanced Practice Midwife

## 2020-02-08 VITALS — BP 131/74 | HR 99 | Wt 190.4 lb

## 2020-02-08 DIAGNOSIS — Z3402 Encounter for supervision of normal first pregnancy, second trimester: Secondary | ICD-10-CM

## 2020-02-08 DIAGNOSIS — Z1389 Encounter for screening for other disorder: Secondary | ICD-10-CM

## 2020-02-08 DIAGNOSIS — Z131 Encounter for screening for diabetes mellitus: Secondary | ICD-10-CM

## 2020-02-08 DIAGNOSIS — Z3A26 26 weeks gestation of pregnancy: Secondary | ICD-10-CM

## 2020-02-08 DIAGNOSIS — Z23 Encounter for immunization: Secondary | ICD-10-CM | POA: Diagnosis not present

## 2020-02-08 DIAGNOSIS — Z331 Pregnant state, incidental: Secondary | ICD-10-CM

## 2020-02-08 LAB — POCT URINALYSIS DIPSTICK OB
Blood, UA: NEGATIVE
Glucose, UA: NEGATIVE
Ketones, UA: NEGATIVE
Leukocytes, UA: NEGATIVE
Nitrite, UA: NEGATIVE
POC,PROTEIN,UA: NEGATIVE

## 2020-02-08 NOTE — Progress Notes (Signed)
   LOW-RISK PREGNANCY VISIT Patient name: Erica Doyle MRN [id number]  Date of birth: 03/26/1995 Chief Complaint:   Routine Prenatal Visit  History of Present Illness:   Erica Doyle is a 24 y.o. G51P0000 female at [redacted]w[redacted]d with an Estimated Date of Delivery: 05/14/20 being seen today for ongoing management of a low-risk pregnancy.  Today she reports doing well. Contractions: Not present. Vag. Bleeding: None.  Movement: Present. denies leaking of fluid. Review of Systems:   Pertinent items are noted in HPI Denies abnormal vaginal discharge w/ itching/odor/irritation, headaches, visual changes, shortness of breath, chest pain, abdominal pain, severe nausea/vomiting, or problems with urination or bowel movements unless otherwise stated above. Pertinent History Reviewed:  Reviewed past medical,surgical, social, obstetrical and family history.  Reviewed problem list, medications and allergies. Physical Assessment:   Vitals:   02/08/20 0900  BP: 131/74  Pulse: 99  Weight: 190 lb 6.4 oz (86.4 kg)  Body mass index is 35.98 kg/m.        Physical Examination:   General appearance: Well appearing, and in no distress  Mental status: Alert, oriented to person, place, and time  Skin: Warm & dry  Cardiovascular: Normal heart rate noted  Respiratory: Normal respiratory effort, no distress  Abdomen: Soft, gravid, nontender  Pelvic: Cervical exam deferred         Extremities: Edema: None  Fetal Status: Fetal Heart Rate (bpm): 143 Fundal Height: 26 cm Movement: Present    Results for orders placed or performed in visit on 02/08/20 (from the past 24 hour(s))  POC Urinalysis Dipstick OB   Collection Time: 02/08/20  8:59 AM  Result Value Ref Range   Color, UA     Clarity, UA     Glucose, UA Negative Negative   Bilirubin, UA     Ketones, UA neg    Spec Grav, UA     Blood, UA neg    pH, UA     POC,PROTEIN,UA Negative Negative, Trace, Small (1+), Moderate (2+), Large (3+), 4+   Urobilinogen,  UA     Nitrite, UA neg    Leukocytes, UA Negative Negative   Appearance     Odor      Assessment & Plan:  1) Low-risk pregnancy G1P0000 at [redacted]w[redacted]d with an Estimated Date of Delivery: 05/14/20     Meds: No orders of the defined types were placed in this encounter.  Labs/procedures today: PN2, Tdap  Plan:  Continue routine obstetrical care   Reviewed: Preterm labor symptoms and general obstetric precautions including but not limited to vaginal bleeding, contractions, leaking of fluid and fetal movement were reviewed in detail with the patient.  All questions were answered. Didn't ask about home bp cuff. Check bp weekly, let us know if >140/90.   Follow-up: Return in about 4 weeks (around 03/07/2020) for LROB, in person.  Orders Placed This Encounter  Procedures  . Tdap vaccine greater than or equal to 7yo IM  . POC Urinalysis Dipstick OB   Arabella Merles [facility] 02/08/2020 9:27 AM

## 2020-02-08 NOTE — Patient Instructions (Signed)
Erica Doyle, I greatly value your feedback.  If you receive a survey following your visit with [place] today, we appreciate you taking the time to fill it out.  Thanks, Philipp Deputy CNM  [facility] ([address] [place], [place] [address]) Entrance C, located off of [address] valet parking  Go to [email or web address] to register for FREE online childbirth classes  [place] Pediatricians/Family Doctors:  [facility] [phone]            [facility] [phone]                 [facility] [phone] (usually not accepting new patients unless you have family there already, you are always welcome to call and ask)       [facility] Department [phone]       [facility] Pediatricians/Family Doctors:   [facility]: [phone]  [facility]: [phone]  [facility]: [phone]  [facility] Doctors:   [facility]: [phone]   [facility]: [phone]  [facility] Doctors:  [facility]: [phone]    Home Blood Pressure Monitoring for Patients   Your provider has recommended that you check your blood pressure (BP) at least once a week at home. If you do not have a blood pressure cuff at home, one will be provided for you. Contact your provider if you have not received your monitor within 1 week.   Helpful Tips for Accurate Home Blood Pressure Checks  . Don't smoke, exercise, or drink caffeine 30 minutes before checking your BP . Use the restroom before checking your BP (a full bladder can raise your pressure) . Relax in a comfortable upright chair . Feet on the ground . Left arm resting comfortably on a flat surface at the level of your heart . Legs uncrossed . Back supported . Sit quietly and don't talk . Place the cuff on your bare arm . Adjust snuggly, so that only two  fingertips can fit between your skin and the top of the cuff . Check 2 readings separated by at least one minute . Keep a log of your BP readings . For a visual, please reference this diagram: http://ccnc.care/bpdiagram  Provider Name: [facility]     Phone: [phone]  Zone 1: ALL CLEAR  Continue to monitor your symptoms:  . BP reading is less than 140 (top number) or less than 90 (bottom number)  . No right upper stomach pain . No headaches or seeing spots . No feeling nauseated or throwing up . No swelling in face and hands  Zone 2: CAUTION Call your doctor's office for any of the following:  . BP reading is greater than 140 (top number) or greater than 90 (bottom number)  . Stomach pain under your ribs in the middle or right side . Headaches or seeing spots . Feeling nauseated or throwing up . Swelling in face and hands  Zone 3: EMERGENCY  Seek immediate medical care if you have any of the following:  . BP reading is greater than160 (top number) or greater than 110 (bottom number) . Severe headaches not improving with Tylenol . Serious difficulty catching your breath . Any worsening symptoms from Zone 2     Second Trimester of Pregnancy The second trimester is from week 14 through week 27 (months 4 through 6). The second trimester is often a time when you feel your best.  Your body has adjusted to being pregnant, and you begin to feel better physically. Usually, morning sickness has lessened or quit completely, you may have more energy, and you may have an increase in appetite. The second trimester is also a time when the fetus is growing rapidly. At the end of the sixth month, the fetus is about 9 inches long and weighs about 1 pounds. You will likely begin to feel the baby move (quickening) between 16 and 20 weeks of pregnancy. Body changes during your second trimester Your body continues to go through many changes during your second trimester. The changes vary from  woman to woman.  Your weight will continue to increase. You will notice your lower abdomen bulging out.  You may begin to get stretch marks on your hips, abdomen, and breasts.  You may develop headaches that can be relieved by medicines. The medicines should be approved by your health care provider.  You may urinate more often because the fetus is pressing on your bladder.  You may develop or continue to have heartburn as a result of your pregnancy.  You may develop constipation because certain hormones are causing the muscles that push waste through your intestines to slow down.  You may develop hemorrhoids or swollen, bulging veins (varicose veins).  You may have back pain. This is caused by: ? Weight gain. ? Pregnancy hormones that are relaxing the joints in your pelvis. ? A shift in weight and the muscles that support your balance.  Your breasts will continue to grow and they will continue to become tender.  Your gums may bleed and may be sensitive to brushing and flossing.  Dark spots or blotches (chloasma, mask of pregnancy) may develop on your face. This will likely fade after the baby is born.  A dark line from your belly button to the pubic area (linea nigra) may appear. This will likely fade after the baby is born.  You may have changes in your hair. These can include thickening of your hair, rapid growth, and changes in texture. Some women also have hair loss during or after pregnancy, or hair that feels dry or thin. Your hair will most likely return to normal after your baby is born.  What to expect at prenatal visits During a routine prenatal visit:  You will be weighed to make sure you and the fetus are growing normally.  Your blood pressure will be taken.  Your abdomen will be measured to track your baby's growth.  The fetal heartbeat will be listened to.  Any test results from the previous visit will be discussed.  Your health care provider may ask  you:  How you are feeling.  If you are feeling the baby move.  If you have had any abnormal symptoms, such as leaking fluid, bleeding, severe headaches, or abdominal cramping.  If you are using any tobacco products, including cigarettes, chewing tobacco, and electronic cigarettes.  If you have any questions.  Other tests that may be performed during your second trimester include:  Blood tests that check for: ? Low iron levels (anemia). ? High blood sugar that affects pregnant women (gestational diabetes) between 38 and 28 weeks. ? Rh antibodies. This is to check for a protein on red blood cells (Rh factor).  Urine tests to check for infections, diabetes, or protein in the urine.  An ultrasound to confirm the proper growth and development of the baby.  An amniocentesis to check for possible genetic problems.  Fetal screens  for spina bifida and Down syndrome.  HIV (human immunodeficiency virus) testing. Routine prenatal testing includes screening for HIV, unless you choose not to have this test.  Follow these instructions at home: Medicines  Follow your health care provider's instructions regarding medicine use. Specific medicines may be either safe or unsafe to take during pregnancy.  Take a prenatal vitamin that contains at least 600 micrograms (mcg) of folic acid.  If you develop constipation, try taking a stool softener if your health care provider approves. Eating and drinking  Eat a balanced diet that includes fresh fruits and vegetables, whole grains, good sources of protein such as meat, eggs, or tofu, and low-fat dairy. Your health care provider will help you determine the amount of weight gain that is right for you.  Avoid raw meat and uncooked cheese. These carry germs that can cause birth defects in the baby.  If you have low calcium intake from food, talk to your health care provider about whether you should take a daily calcium supplement.  Limit foods that  are high in fat and processed sugars, such as fried and sweet foods.  To prevent constipation: ? Drink enough fluid to keep your urine clear or pale yellow. ? Eat foods that are high in fiber, such as fresh fruits and vegetables, whole grains, and beans. Activity  Exercise only as directed by your health care provider. Most women can continue their usual exercise routine during pregnancy. Try to exercise for 30 minutes at least 5 days a week. Stop exercising if you experience uterine contractions.  Avoid heavy lifting, wear low heel shoes, and practice good posture.  A sexual relationship may be continued unless your health care provider directs you otherwise. Relieving pain and discomfort  Wear a good support bra to prevent discomfort from breast tenderness.  Take warm sitz baths to soothe any pain or discomfort caused by hemorrhoids. Use hemorrhoid cream if your health care provider approves.  Rest with your legs elevated if you have leg cramps or low back pain.  If you develop varicose veins, wear support hose. Elevate your feet for 15 minutes, 3-4 times a day. Limit salt in your diet. Prenatal Care  Write down your questions. Take them to your prenatal visits.  Keep all your prenatal visits as told by your health care provider. This is important. Safety  Wear your seat belt at all times when driving.  Make a list of emergency phone numbers, including numbers for family, friends, the hospital, and police and fire departments. General instructions  Ask your health care provider for a referral to a local prenatal education class. Begin classes no later than the beginning of month 6 of your pregnancy.  Ask for help if you have counseling or nutritional needs during pregnancy. Your health care provider can offer advice or refer you to specialists for help with various needs.  Do not use hot tubs, steam rooms, or saunas.  Do not douche or use tampons or scented sanitary  pads.  Do not cross your legs for long periods of time.  Avoid cat litter boxes and soil used by cats. These carry germs that can cause birth defects in the baby and possibly loss of the fetus by miscarriage or stillbirth.  Avoid all smoking, herbs, alcohol, and unprescribed drugs. Chemicals in these products can affect the formation and growth of the baby.  Do not use any products that contain nicotine or tobacco, such as cigarettes and e-cigarettes. If you need help quitting,  ask your health care provider.  Visit your dentist if you have not gone yet during your pregnancy. Use a soft toothbrush to brush your teeth and be gentle when you floss. Contact a health care provider if:  You have dizziness.  You have mild pelvic cramps, pelvic pressure, or nagging pain in the abdominal area.  You have persistent nausea, vomiting, or diarrhea.  You have a bad smelling vaginal discharge.  You have pain when you urinate. Get help right away if:  You have a fever.  You are leaking fluid from your vagina.  You have spotting or bleeding from your vagina.  You have severe abdominal cramping or pain.  You have rapid weight gain or weight loss.  You have shortness of breath with chest pain.  You notice sudden or extreme swelling of your face, hands, ankles, feet, or legs.  You have not felt your baby move in over an hour.  You have severe headaches that do not go away when you take medicine.  You have vision changes. Summary  The second trimester is from week 14 through week 27 (months 4 through 6). It is also a time when the fetus is growing rapidly.  Your body goes through many changes during pregnancy. The changes vary from woman to woman.  Avoid all smoking, herbs, alcohol, and unprescribed drugs. These chemicals affect the formation and growth your baby.  Do not use any tobacco products, such as cigarettes, chewing tobacco, and e-cigarettes. If you need help quitting, ask your  health care provider.  Contact your health care provider if you have any questions. Keep all prenatal visits as told by your health care provider. This is important. This information is not intended to replace advice given to you by your health care provider. Make sure you discuss any questions you have with your health care provider. Document Released: 02/04/2001 Document Revised: 07/19/2015 Document Reviewed: 04/13/2012 Elsevier Interactive Patient Education  2017 [employer].

## 2020-02-09 LAB — GLUCOSE TOLERANCE, 2 HOURS W/ 1HR
Glucose, 1 hour: 104 mg/dL (ref 65–179)
Glucose, 2 hour: 81 mg/dL (ref 65–152)
Glucose, Fasting: 75 mg/dL (ref 65–91)

## 2020-02-09 LAB — CBC
Hematocrit: 32.3 % — ABNORMAL LOW (ref 34.0–46.6)
Hemoglobin: 10.8 g/dL — ABNORMAL LOW (ref 11.1–15.9)
MCH: 29.5 pg (ref 26.6–33.0)
MCHC: 33.4 g/dL (ref 31.5–35.7)
MCV: 88 fL (ref 79–97)
Platelets: 298 10*3/uL (ref 150–450)
RBC: 3.66 x10E6/uL — ABNORMAL LOW (ref 3.77–5.28)
RDW: 13 % (ref 11.7–15.4)
WBC: 12.4 10*3/uL — ABNORMAL HIGH (ref 3.4–10.8)

## 2020-02-09 LAB — HIV ANTIBODY (ROUTINE TESTING W REFLEX): HIV Screen 4th Generation wRfx: NONREACTIVE

## 2020-02-09 LAB — ANTIBODY SCREEN: Antibody Screen: NEGATIVE

## 2020-02-09 LAB — RPR: RPR Ser Ql: NONREACTIVE

## 2020-02-25 NOTE — L&D Delivery Note (Signed)
Delivery Note At 1747 a viable female infant was delivered via SVD, presentation: LOA. APGAR: 3, 7, 9; weight pending.   Placenta status: spontaneously delivered intact with gentle cord traction. Fundus firm with massage and Pitocin.   Anesthesia: local Lacerations: 2nd degree perineal repaired and peri-clitoral hemostatic Suture used for repair: 2-0 Vicryl rapide Est. Blood Loss (mL): 100 Placenta to LD Complications none Cord ph n/a   Mom to postpartum. Baby to Couplet care / Skin to Skin.    Donette Larry, CNM 05/09/2020 6:25 PM

## 2020-02-29 ENCOUNTER — Encounter: Payer: Self-pay | Admitting: *Deleted

## 2020-02-29 DIAGNOSIS — Z3402 Encounter for supervision of normal first pregnancy, second trimester: Secondary | ICD-10-CM

## 2020-03-05 ENCOUNTER — Encounter: Payer: Self-pay | Admitting: *Deleted

## 2020-03-07 ENCOUNTER — Ambulatory Visit (INDEPENDENT_AMBULATORY_CARE_PROVIDER_SITE_OTHER): Payer: Medicaid Other | Admitting: Advanced Practice Midwife

## 2020-03-07 ENCOUNTER — Other Ambulatory Visit: Payer: Self-pay

## 2020-03-07 VITALS — BP 127/82 | HR 101 | Wt 193.0 lb

## 2020-03-07 DIAGNOSIS — Z1389 Encounter for screening for other disorder: Secondary | ICD-10-CM

## 2020-03-07 DIAGNOSIS — Z3403 Encounter for supervision of normal first pregnancy, third trimester: Secondary | ICD-10-CM

## 2020-03-07 DIAGNOSIS — Z3A30 30 weeks gestation of pregnancy: Secondary | ICD-10-CM

## 2020-03-07 DIAGNOSIS — Z331 Pregnant state, incidental: Secondary | ICD-10-CM

## 2020-03-07 NOTE — Progress Notes (Signed)
   PRENATAL VISIT NOTE  Subjective:  Erica Doyle is a 25 y.o. G1P0000 at [redacted]w[redacted]d being seen today for ongoing prenatal care.  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  Patient reports no complaints.  Contractions: Not present. Vag. Bleeding: None.  Movement: Present. Denies leaking of fluid.   The following portions of the patient's history were reviewed and updated as appropriate: allergies, current medications, past family history, past medical history, past social history, past surgical history and problem list. Problem list updated.  Objective:   Vitals:   03/07/20 0950  BP: 127/82  Pulse: (!) 101  Weight: 193 lb (87.5 kg)    Fetal Status: Fetal Heart Rate (bpm): 140 Fundal Height: 30 cm Movement: Present     General:  Alert, oriented and cooperative. Patient is in no acute distress.  Skin: Skin is warm and dry. No rash noted.   Cardiovascular: Normal heart rate noted  Respiratory: Normal respiratory effort, no problems with respiration noted  Abdomen: Soft, gravid, appropriate for gestational age.  Pain/Pressure: Absent     Pelvic: Cervical exam deferred        Extremities: Normal range of motion.  Edema: None  Mental Status: Normal mood and affect. Normal behavior. Normal judgment and thought content.   Assessment and Plan:  Pregnancy: G1P0000 at [redacted]w[redacted]d  1. Encounter for supervision of normal first pregnancy in third trimester - LOB, routine care - Instructed on daily kick counts, interventions for low kick number, indications for evaluation in MAU  2. Screening for genitourinary condition  - POC Urinalysis Dipstick OB  Preterm labor symptoms and general obstetric precautions including but not limited to vaginal bleeding, contractions, leaking of fluid and fetal movement were reviewed in detail with the patient. Please refer to  After Visit Summary for other counseling recommendations.  Return in about 2 weeks (around 03/21/2020).  Future Appointments  Date Time Provider Department Center  03/21/2020  9:50 AM Raelyn Mora, CNM [facility]    [place], [place]

## 2020-03-07 NOTE — Patient Instructions (Signed)
Fetal Movement Counts Patient Name: ________________________________________________ Patient Due Date: ____________________  What is a fetal movement count? A fetal movement count is the number of times that you feel your baby move during a certain amount of time. This may also be called a fetal kick count. A fetal movement count is recommended for every pregnant woman. You may be asked to start counting fetal movements as early as week 28 of your pregnancy. Pay attention to when your baby is most active. You may notice your baby's sleep and wake cycles. You may also notice things that make your baby move more. You should do a fetal movement count:  When your baby is normally most active.  At the same time each day. A good time to count movements is while you are resting, after having something to eat and drink. How do I count fetal movements? 1. Find a quiet, comfortable area. Sit, or lie down on your side. 2. Write down the date, the start time and stop time, and the number of movements that you felt between those two times. Take this information with you to your health care visits. 3. Write down your start time when you feel the first movement. 4. Count kicks, flutters, swishes, rolls, and jabs. You should feel at least 10 movements. 5. You may stop counting after you have felt 10 movements, or if you have been counting for 2 hours. Write down the stop time. 6. If you do not feel 10 movements in 2 hours, contact your health care provider for further instructions. Your health care provider may want to do additional tests to assess your baby's well-being. Contact a health care provider if:  You feel fewer than 10 movements in 2 hours.  Your baby is not moving like he or she usually does. Date: ____________ Start time: ____________ Stop time: ____________ Movements: ____________ Date: ____________ Start time: ____________ Stop time: ____________ Movements: ____________ Date: ____________  Start time: ____________ Stop time: ____________ Movements: ____________ Date: ____________ Start time: ____________ Stop time: ____________ Movements: ____________ Date: ____________ Start time: ____________ Stop time: ____________ Movements: ____________ Date: ____________ Start time: ____________ Stop time: ____________ Movements: ____________ Date: ____________ Start time: ____________ Stop time: ____________ Movements: ____________ Date: ____________ Start time: ____________ Stop time: ____________ Movements: ____________ Date: ____________ Start time: ____________ Stop time: ____________ Movements: ____________ This information is not intended to replace advice given to you by your health care provider. Make sure you discuss any questions you have with your health care provider. Document Revised: 09/30/2018 Document Reviewed: 09/30/2018 Elsevier Patient Education  2021 Elsevier Inc.  

## 2020-03-20 ENCOUNTER — Encounter: Payer: Self-pay | Admitting: *Deleted

## 2020-03-21 ENCOUNTER — Encounter: Payer: Self-pay | Admitting: Obstetrics and Gynecology

## 2020-03-21 ENCOUNTER — Other Ambulatory Visit: Payer: Self-pay

## 2020-03-21 ENCOUNTER — Ambulatory Visit (INDEPENDENT_AMBULATORY_CARE_PROVIDER_SITE_OTHER): Payer: Medicaid Other | Admitting: Obstetrics and Gynecology

## 2020-03-21 VITALS — BP 131/75 | HR 93 | Wt 199.0 lb

## 2020-03-21 DIAGNOSIS — O26843 Uterine size-date discrepancy, third trimester: Secondary | ICD-10-CM

## 2020-03-21 DIAGNOSIS — Z3A32 32 weeks gestation of pregnancy: Secondary | ICD-10-CM

## 2020-03-21 DIAGNOSIS — Z3403 Encounter for supervision of normal first pregnancy, third trimester: Secondary | ICD-10-CM

## 2020-03-21 NOTE — Patient Instructions (Signed)

## 2020-03-21 NOTE — Progress Notes (Signed)
   LOW-RISK PREGNANCY OFFICE VISIT Patient name: Erica Doyle MRN [id number]  Date of birth: Jan 20, 1996 Chief Complaint:   Routine Prenatal Visit  History of Present Illness:   Erica Doyle is a 25 y.o. G52P0000 female at [redacted]w[redacted]d with an Estimated Date of Delivery: 05/14/20 being seen today for ongoing management of a low-risk pregnancy.  Today she reports no complaints. Contractions: Not present. Vag. Bleeding: None.  Movement: Present. denies leaking of fluid. Review of Systems:   Pertinent items are noted in HPI Denies abnormal vaginal discharge w/ itching/odor/irritation, headaches, visual changes, shortness of breath, chest pain, abdominal pain, severe nausea/vomiting, or problems with urination or bowel movements unless otherwise stated above. Pertinent History Reviewed:  Reviewed past medical,surgical, social, obstetrical and family history.  Reviewed problem list, medications and allergies. Physical Assessment:   Vitals:   03/21/20 0940  BP: 131/75  Pulse: 93  Weight: 199 lb (90.3 kg)  Body mass index is 37.6 kg/m.        Physical Examination:   General appearance: Well appearing, and in no distress  Mental status: Alert, oriented to person, place, and time  Skin: Warm & dry  Cardiovascular: Normal heart rate noted  Respiratory: Normal respiratory effort, no distress  Abdomen: Soft, gravid, nontender  Pelvic: Cervical exam deferred         Extremities: Edema: None  Fetal Status: Fetal Heart Rate (bpm): 145 Fundal Height: 36 cm Movement: Present Presentation: Vertex  No results found for this or any previous visit (from the past 24 hour(s)).  Assessment & Plan:  1) Low-risk pregnancy G1P0000 at [redacted]w[redacted]d with an Estimated Date of Delivery: 05/14/20   2) Encounter for supervision of normal first pregnancy in third trimester - Anticipatory guidance for GBS screening at 36 wks. Explained the test is important to be done at this time in pregnancy to ensure adequate treatment at  the time of delivery. Explained that a positive result does not mean any harm to her, but can be harmful to the baby. Meaning that if baby is exposed to the bacteria for too long without antibiotics, the bay has the potential to develop pneumonia, septicemia, or spinal meningitis and could end up in the NICU. Also, explained that a cervical exam may be performed at the time of testing to get a baseline cervical check and make sure there is no preterm cervical dilation.  3) [redacted] weeks gestation of pregnancy  4) Uterine size date discrepancy pregnancy, third trimester - Needs growth U/S at nv   Meds: No orders of the defined types were placed in this encounter.  Labs/procedures today: none  Plan:  Continue routine obstetrical care   Reviewed: Preterm labor symptoms and general obstetric precautions including but not limited to vaginal bleeding, contractions, leaking of fluid and fetal movement were reviewed in detail with the patient.  All questions were answered. Has home bp cuff. Check bp weekly, let us know if >140/90.   Follow-up: Return in about 4 weeks (around 04/18/2020) for Return OB w/GBS.  No orders of the defined types were placed in this encounter.  Raelyn Mora MSN, CNM 03/21/2020 10:01 AM

## 2020-03-26 ENCOUNTER — Other Ambulatory Visit: Payer: Self-pay | Admitting: Women's Health

## 2020-03-26 MED ORDER — PANTOPRAZOLE SODIUM 20 MG PO TBEC: 20.0000 mg | DELAYED_RELEASE_TABLET | Freq: Every day | ORAL | 3 refills | Status: DC

## 2020-04-11 ENCOUNTER — Encounter (HOSPITAL_COMMUNITY): Payer: Self-pay | Admitting: Obstetrics and Gynecology

## 2020-04-11 ENCOUNTER — Inpatient Hospital Stay (HOSPITAL_COMMUNITY)
Admission: AD | Admit: 2020-04-11 | Discharge: 2020-04-12 | Disposition: A | Discharge status: Home / Self Care | Payer: Medicaid Other | Attending: Obstetrics and Gynecology | Admitting: Obstetrics and Gynecology

## 2020-04-11 ENCOUNTER — Other Ambulatory Visit: Payer: Self-pay

## 2020-04-11 DIAGNOSIS — R109 Unspecified abdominal pain: Secondary | ICD-10-CM | POA: Diagnosis not present

## 2020-04-11 DIAGNOSIS — R1030 Lower abdominal pain, unspecified: Secondary | ICD-10-CM | POA: Diagnosis present

## 2020-04-11 DIAGNOSIS — O4700 False labor before 37 completed weeks of gestation, unspecified trimester: Secondary | ICD-10-CM

## 2020-04-11 DIAGNOSIS — Z880 Allergy status to penicillin: Secondary | ICD-10-CM | POA: Insufficient documentation

## 2020-04-11 DIAGNOSIS — Z3A35 35 weeks gestation of pregnancy: Secondary | ICD-10-CM | POA: Insufficient documentation

## 2020-04-11 DIAGNOSIS — O26893 Other specified pregnancy related conditions, third trimester: Secondary | ICD-10-CM | POA: Diagnosis not present

## 2020-04-11 DIAGNOSIS — O4703 False labor before 37 completed weeks of gestation, third trimester: Secondary | ICD-10-CM | POA: Diagnosis not present

## 2020-04-11 DIAGNOSIS — Z87891 Personal history of nicotine dependence: Secondary | ICD-10-CM | POA: Diagnosis not present

## 2020-04-11 DIAGNOSIS — O479 False labor, unspecified: Secondary | ICD-10-CM

## 2020-04-11 LAB — URINALYSIS, ROUTINE W REFLEX MICROSCOPIC
Bilirubin Urine: NEGATIVE
Glucose, UA: NEGATIVE mg/dL
Hgb urine dipstick: NEGATIVE
Ketones, ur: 20 mg/dL — AB
Nitrite: NEGATIVE
Protein, ur: NEGATIVE mg/dL
Specific Gravity, Urine: 1.006 (ref 1.005–1.030)
pH: 6 (ref 5.0–8.0)

## 2020-04-11 NOTE — MAU Provider Note (Signed)
Chief Complaint:  Abdominal Pain   Event Date/Time   First Provider Initiated Contact with Patient 04/11/20 2308     HPI: Erica Doyle is a 25 y.o. G1P0000 at 69w2dwho presents to maternity admissions reporting painful contractions since 2015.  Marland Kitchen She reports good fetal movement, denies LOF, vaginal bleeding, vaginal itching/burning, urinary symptoms, h/a, dizziness, n/v, diarrhea, constipation or fever/chills.    Abdominal Pain This is a new problem. The current episode started today. The onset quality is gradual. The problem occurs intermittently. The problem has been unchanged. The quality of the pain is cramping. The abdominal pain does not radiate. Pertinent negatives include no constipation, diarrhea, dysuria, fever, myalgias, nausea or vomiting. Nothing aggravates the pain. The pain is relieved by nothing. She has tried nothing for the symptoms.   RN Note: Erica Doyle is a 25 y.o. at [redacted]w[redacted]d here in MAU reporting: Intermittent lower abdominal pain that began around 8:15 pm. The pain feels like contractions. +FM. Denies vaginal bleeding or leaking of fluid. Pain score: 7/10   Past Medical History: Past Medical History:  Diagnosis Date  . Medical history non-contributory   . Nexplanon insertion 12/31/2012   nexplanon inserted left arm 12/31/12 remove 01/01/16  . Trichimoniasis 04/07/2019   Treated 04/07/19,  Poc____________    Past obstetric history: OB History  Gravida Para Term Preterm AB Living  1 0 0 0 0 0  SAB IAB Ectopic Multiple Live Births  0 0 0 0 0    # Outcome Date GA Lbr Len/2nd Weight Sex Delivery Anes PTL Lv  1 Current             Past Surgical History: Past Surgical History:  Procedure Laterality Date  . KNEE SURGERY Left     Family History: Family History  Problem Relation Age of Onset  . Hypertension Maternal Grandmother   . Hypertension Paternal Grandmother     Social History: Social History   Tobacco Use  . Smoking status: Former Games developer  .  Smokeless tobacco: Never Used  Vaping Use  . Vaping Use: Never used  Substance Use Topics  . Alcohol use: No  . Drug use: No    Allergies:  Allergies  Allergen Reactions  . Penicillins Other (See Comments)    Has patient had a PCN reaction causing immediate rash, facial/tongue/throat swelling, SOB or lightheadedness with hypotension: No Has patient had a PCN reaction causing severe rash involving mucus membranes or skin necrosis: No Has patient had a PCN reaction that required hospitalization: Yes Has patient had a PCN reaction occurring within the last 10 years: No If all of the above answers are "NO", then may proceed with Cephalosporin use.     Meds:  Medications Prior to Admission  Medication Sig Dispense Refill Last Dose  . pantoprazole (PROTONIX) 20 MG tablet Take 1 tablet (20 mg total) by mouth daily. 30 tablet 3 04/11/2020 at Unknown time  . Prenatal Vit-Iron Carbonyl-FA (PRENATAL PLUS IRON) 29-1 MG TABS Take 1 daily 30 tablet 12 04/11/2020 at Unknown time  . PROVENTIL HFA 108 (90 BASE) MCG/ACT inhaler Inhale 2 puffs into the lungs every 4 (four) hours as needed for wheezing or shortness of breath.   1 More than a month at Unknown time    I have reviewed patient's Past Medical Hx, Surgical Hx, Family Hx, Social Hx, medications and allergies.   ROS:  Review of Systems  Constitutional: Negative for fever.  Gastrointestinal: Positive for abdominal pain. Negative for constipation, diarrhea, nausea  and vomiting.  Genitourinary: Negative for dysuria.  Musculoskeletal: Negative for myalgias.   Other systems negative  Physical Exam   Patient Vitals for the past 24 hrs:  BP Temp Temp src Pulse Resp SpO2 Height Weight  04/11/20 2238 130/60 98.4 F (36.9 C) Oral 94 16 98 % 5\' 1"  (1.549 m) 94.3 kg   Constitutional: Well-developed, well-nourished female in no acute distress.  Cardiovascular: normal rate and rhythm Respiratory: normal effort, clear to auscultation  bilaterally GI: Abd soft, non-tender, gravid appropriate for gestational age.   No rebound or guarding. MS: Extremities nontender, no edema, normal ROM Neurologic: Alert and oriented x 4.  GU: Neg CVAT.  PELVIC EXAM:  Dilation: Closed Effacement (%): 50 Exam by:: [email or web address], CNM  FHT:  Baseline 130 , moderate variability, accelerations present, no decelerations Contractions: q 3-8 mins Irregular     Labs: Results for orders placed or performed during the hospital encounter of 04/11/20 (from the past 24 hour(s))  Urinalysis, Routine w reflex microscopic Urine, Clean Catch     Status: Abnormal   Collection Time: 04/11/20 10:44 PM  Result Value Ref Range   Color, Urine YELLOW YELLOW   APPearance HAZY (A) CLEAR   Specific Gravity, Urine 1.006 1.005 - 1.030   pH 6.0 5.0 - 8.0   Glucose, UA NEGATIVE NEGATIVE mg/dL   Hgb urine dipstick NEGATIVE NEGATIVE   Bilirubin Urine NEGATIVE NEGATIVE   Ketones, ur 20 (A) NEGATIVE mg/dL   Protein, ur NEGATIVE NEGATIVE mg/dL   Nitrite NEGATIVE NEGATIVE   Leukocytes,Ua MODERATE (A) NEGATIVE   RBC / HPF 0-5 0 - 5 RBC/hpf   WBC, UA 11-20 0 - 5 WBC/hpf   Bacteria, UA MANY (A) NONE SEEN   Squamous Epithelial / LPF 0-5 0 - 5  Urine sent to culture  A/Positive/-- (09/08 1225)  Imaging:  No results found.  MAU Course/MDM: I have ordered labs and reviewed results. Urine sent for culture. No dysuria, so will not treat at this time NST reviewed, reactive Discussed this may be preterm labor or simply Braxton Hicks contractions.  She declined tocolysis.    We observed her for over an hour and rechecked her     There was no change in her cervical exam on repeat exam.  Treatments in MAU included EFM.    Assessment: Single IUP at [redacted]w[redacted]d Preterm uterine contractions vs Braxton Hicks contractions No change in cervix  Plan: Discharge home Preterm Labor precautions and fetal kick counts Follow up in Office for prenatal visits and recheck of cervix   Encouraged to return if she develops worsening of symptoms, increase in pain, fever, or other concerning symptoms.   Pt stable at time of discharge.  [redacted]w[redacted]d CNM, MSN Certified Nurse-Midwife 04/11/2020 11:52 PM

## 2020-04-11 NOTE — MAU Note (Signed)
..  Erica Doyle is a 25 y.o. at [redacted]w[redacted]d here in MAU reporting: Intermittent lower abdominal pain that began around 8:15 pm. The pain feels like contractions. +FM. Denies vaginal bleeding or leaking of fluid. Pain score: 7/10 Vitals:   04/11/20 2238  BP: 130/60  Pulse: 94  Resp: 16  Temp: 98.4 F (36.9 C)  SpO2: 98%     FHT:145 Lab orders placed from triage: UA

## 2020-04-12 NOTE — Discharge Instructions (Signed)
Preterm Labor Pregnancy normally lasts 39-41 weeks. Preterm labor is when labor starts before you have been pregnant for 37 weeks. Babies who are born too early may have problems with blood sugar, body temperature, heart, and breathing. These problems may be very serious in babies who are born before 34 weeks of pregnancy. What are the causes? The cause of this condition is not known. What increases the risk? You are more likely to have preterm labor if:  You have medical problems, now or in the past.  You have problems now or in your past pregnancies.  You have lifestyle problems. Medical history  You have problems of the womb (uterus).  You have an infection, including infections you get from sex.  You have problems that do not go away, such as: ? Blood clots. ? High blood pressure. ? High blood sugar.  You have low body weight or too much body weight. Present and past pregnancies  You have had preterm labor before.  You are pregnant with two babies or more.  You have a condition in which the placenta covers your cervix.  You waited less than 6 months between giving birth and becoming pregnant again.  Your unborn baby has some problems.  You have bleeding from your vagina.  You became pregnant by a method called IVF. Lifestyle  You smoke.  You drink alcohol.  You use drugs.  You have stress.  You have abuse in your home.  You come in contact with chemicals that harm the body (pollutants). Other factors  You are younger than 17 years or older than 35 years. What are the signs or symptoms? Symptoms of this condition include:  Cramps. The cramps may feel like cramps from a period.  You may have watery poop (diarrhea).  Pain in the belly (abdomen).  Pain in the lower back.  Regular contractions. It may feel like your belly is getting tighter.  Pressure in the lower belly.  More fluid leaking from the vagina. The fluid may be watery or  bloody.  Water breaking. How is this treated? Treatment for this condition depends on your health, the health of your baby, and how old your pregnancy is. It may include:  Taking medicines, such as: ? Hormone medicines. ? Medicines to stop contractions. ? Medicines to help mature the baby's lungs. ? Medicines to prevent your baby from getting cerebral palsy.  Bed rest. If the labor happens before 34 weeks of pregnancy, you may need to stay in the hospital.  Delivering the baby. Follow these instructions at home:  Do not use any products that contain nicotine or tobacco, such as cigarettes, e-cigarettes, and chewing tobacco. If you need help quitting, ask your doctor.  Do not drink alcohol.  Take over-the-counter and prescription medicines only as told by your doctor.  Rest as told by your doctor.  Return to your activities as told by your doctor. Ask your doctor what activities are safe for you.  Keep all follow-up visits as told by your doctor. This is important.   How is this prevented? To have a healthy pregnancy:  Do not use street drugs.  Do not use any medicines unless you ask your doctor if they are safe for you.  Talk with your doctor before taking any herbal supplements.  Make sure you gain enough weight.  Watch for infection. If you think you might have an infection, get it checked right away. Symptoms of infection may include: ? Fever. ? Vaginal discharge. ?   Pain or burning when you pee. ? Needing to pee urgently. ? Needing to pee often. ? Peeing small amounts often. ? Blood in your pee. ? Pee that smells bad or unusual.  Tell your doctor if you have gone into preterm labor before. Contact a doctor if:  You think you are going into preterm labor.  You have symptoms of preterm labor.  You have symptoms of infection. Get help right away if:  You are having painful contractions every 5 minutes or less.  Your water breaks. Summary  Preterm labor  is labor that starts before you reach 37 weeks of pregnancy.  Your baby may have problems if delivered early.  The cause of preterm labor is not known. Having problems of the womb (uterus), an infection, or bleeding during pregnancy increases the risk.  Contact a doctor if you have signs or symptoms of preterm labor. This information is not intended to replace advice given to you by your health care provider. Make sure you discuss any questions you have with your health care provider. Document Revised: 03/15/2019 Document Reviewed: 03/15/2019 Elsevier Patient Education  2021 Elsevier Inc.  

## 2020-04-13 LAB — CULTURE, OB URINE: Culture: <10000 — AB

## 2020-04-17 ENCOUNTER — Other Ambulatory Visit: Payer: Self-pay | Admitting: Obstetrics and Gynecology

## 2020-04-17 DIAGNOSIS — O26843 Uterine size-date discrepancy, third trimester: Secondary | ICD-10-CM

## 2020-04-18 ENCOUNTER — Other Ambulatory Visit: Payer: Self-pay

## 2020-04-18 ENCOUNTER — Ambulatory Visit (INDEPENDENT_AMBULATORY_CARE_PROVIDER_SITE_OTHER): Payer: Medicaid Other | Admitting: Advanced Practice Midwife

## 2020-04-18 ENCOUNTER — Other Ambulatory Visit: Payer: Medicaid Other

## 2020-04-18 ENCOUNTER — Ambulatory Visit (INDEPENDENT_AMBULATORY_CARE_PROVIDER_SITE_OTHER): Payer: Medicaid Other

## 2020-04-18 ENCOUNTER — Other Ambulatory Visit (HOSPITAL_COMMUNITY)
Admission: RE | Admit: 2020-04-18 | Discharge: 2020-04-18 | Disposition: A | Discharge status: Home / Self Care | Payer: Medicaid Other | Source: Ambulatory Visit | Attending: Advanced Practice Midwife | Admitting: Advanced Practice Midwife

## 2020-04-18 VITALS — BP 129/76 | HR 85 | Wt 208.0 lb

## 2020-04-18 DIAGNOSIS — O26843 Uterine size-date discrepancy, third trimester: Secondary | ICD-10-CM

## 2020-04-18 DIAGNOSIS — Z3A36 36 weeks gestation of pregnancy: Secondary | ICD-10-CM | POA: Diagnosis not present

## 2020-04-18 DIAGNOSIS — Z880 Allergy status to penicillin: Secondary | ICD-10-CM

## 2020-04-18 DIAGNOSIS — Z3403 Encounter for supervision of normal first pregnancy, third trimester: Secondary | ICD-10-CM

## 2020-04-18 NOTE — Patient Instructions (Signed)

## 2020-04-18 NOTE — Progress Notes (Signed)
   PRENATAL VISIT NOTE  Subjective:  Erica Doyle is a 25 y.o. G1P0000 at [redacted]w[redacted]d being seen today for ongoing prenatal care.  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  Patient reports no complaints.  Contractions: Not present. Vag. Bleeding: None.  Movement: Present. Denies leaking of fluid.   The following portions of the patient's history were reviewed and updated as appropriate: allergies, current medications, past family history, past medical history, past social history, past surgical history and problem list. Problem list updated.  Objective:   Vitals:   04/18/20 1102  BP: 129/76  Pulse: 85  Weight: 208 lb (94.3 kg)    Fetal Status:     Movement: Present     General:  Alert, oriented and cooperative. Patient is in no acute distress.  Skin: Skin is warm and dry. No rash noted.   Cardiovascular: Normal heart rate noted  Respiratory: Normal respiratory effort, no problems with respiration noted  Abdomen: Soft, gravid, appropriate for gestational age.  Pain/Pressure: Present     Pelvic: Cervical exam deferred        Extremities: Normal range of motion.  Edema: Trace  Mental Status: Normal mood and affect. Normal behavior. Normal judgment and thought content.   Assessment and Plan:  Pregnancy: G1P0000 at [redacted]w[redacted]d  1. Encounter for supervision of normal first pregnancy in third trimester - LOB, routine care - Reviewed indications for MAU including labor, ROM, bleeding, DFM - Strep Gp B NAA+Rflx - Cervicovaginal ancillary only( [place])  2. [redacted] weeks gestation of pregnancy  - Strep Gp B NAA+Rflx - Cervicovaginal ancillary only( [place])  3. Penicillin allergy - GBS collected with sensitivities  4. Uterine size date discrepancy pregnancy, third trimester - [place] today, EFW 72%  Preterm labor symptoms and general obstetric  precautions including but not limited to vaginal bleeding, contractions, leaking of fluid and fetal movement were reviewed in detail with the patient. Please refer to After Visit Summary for other counseling recommendations.  Return in about 1 week (around 04/25/2020).  Future Appointments  Date Time Provider Department Center  04/26/2020 10:50 AM Cheral Marker, CNM [facility]    [place], [place]

## 2020-04-18 NOTE — Progress Notes (Signed)
[place] 36+2 wks,cephalic,posterior placenta gr 3,fhr 133 bpm,afi 15.9 cm,EFW 3095 g 72%,AC 94%

## 2020-04-19 LAB — CERVICOVAGINAL ANCILLARY ONLY
Chlamydia: POSITIVE — AB
Comment: NEGATIVE
Comment: NORMAL
Neisseria Gonorrhea: NEGATIVE

## 2020-04-20 ENCOUNTER — Encounter: Payer: Self-pay | Admitting: Advanced Practice Midwife

## 2020-04-20 DIAGNOSIS — A749 Chlamydial infection, unspecified: Secondary | ICD-10-CM | POA: Insufficient documentation

## 2020-04-20 DIAGNOSIS — O98819 Other maternal infectious and parasitic diseases complicating pregnancy, unspecified trimester: Secondary | ICD-10-CM | POA: Insufficient documentation

## 2020-04-20 LAB — STREP GP B NAA+RFLX: Strep Gp B NAA+Rflx: NEGATIVE

## 2020-04-20 MED ORDER — AZITHROMYCIN 500 MG PO TABS: 1000.0000 mg | ORAL_TABLET | Freq: Once | ORAL | 0 refills | Status: AC

## 2020-04-20 NOTE — Addendum Note (Signed)
Addended by: Calvert Cantor on: 04/20/2020 07:54 AM   Modules accepted: Orders

## 2020-04-26 ENCOUNTER — Other Ambulatory Visit: Payer: Self-pay

## 2020-04-26 ENCOUNTER — Encounter: Payer: Self-pay | Admitting: Women's Health

## 2020-04-26 ENCOUNTER — Ambulatory Visit (INDEPENDENT_AMBULATORY_CARE_PROVIDER_SITE_OTHER): Payer: Medicaid Other | Admitting: Women's Health

## 2020-04-26 VITALS — BP 134/85 | HR 97 | Wt 210.0 lb

## 2020-04-26 DIAGNOSIS — Z3483 Encounter for supervision of other normal pregnancy, third trimester: Secondary | ICD-10-CM

## 2020-04-26 DIAGNOSIS — Z3403 Encounter for supervision of normal first pregnancy, third trimester: Secondary | ICD-10-CM

## 2020-04-26 NOTE — Patient Instructions (Signed)
Erica Doyle, I greatly value your feedback.  If you receive a survey following your visit with [place] today, we appreciate you taking the time to fill it out.  Thanks, Joellyn Haff, CNM, WHNP-BC  [facility] ([address] [place], [place] [address]) Entrance C, located off of [address] valet parking   Go to [email or web address] to register for FREE online childbirth classes    Call the office [phone]) or go to [facility] if:  You begin to have strong, frequent contractions  Your water breaks.  Sometimes it is a big gush of fluid, sometimes it is just a trickle that keeps getting your panties wet or running down your legs  You have vaginal bleeding.  It is normal to have a small amount of spotting if your cervix was checked.   You don't feel your baby moving like normal.  If you don't, get you something to eat and drink and lay down and focus on feeling your baby move.  You should feel at least 10 movements in 2 hours.  If you don't, you should call the office or go to [facility].   Call the office [phone]) or go to [facility] hospital for these signs of pre-eclampsia:  Severe headache that does not go away with Tylenol  Visual changes- seeing spots, double, blurred vision  Pain under your right breast or upper abdomen that does not go away with Tums or heartburn medicine  Nausea and/or vomiting  Severe swelling in your hands, feet, and face    Home Blood Pressure Monitoring for Patients   Your provider has recommended that you check your blood pressure (BP) at least once a week at home. If you do not have a blood pressure cuff at home, one will be provided for you. Contact your provider if you have not received your monitor within 1 week.   Helpful Tips for Accurate Home Blood Pressure Checks  . Don't smoke, exercise, or drink caffeine 30 minutes before checking your BP . Use the restroom before checking your BP (a full  bladder can raise your pressure) . Relax in a comfortable upright chair . Feet on the ground . Left arm resting comfortably on a flat surface at the level of your heart . Legs uncrossed . Back supported . Sit quietly and don't talk . Place the cuff on your bare arm . Adjust snuggly, so that only two fingertips can fit between your skin and the top of the cuff . Check 2 readings separated by at least one minute . Keep a log of your BP readings . For a visual, please reference this diagram: http://ccnc.care/bpdiagram  Provider Name: [facility]     Phone: [phone]  Zone 1: ALL CLEAR  Continue to monitor your symptoms:  . BP reading is less than 140 (top number) or less than 90 (bottom number)  . No right upper stomach pain . No headaches or seeing spots . No feeling nauseated or throwing up . No swelling in face and hands  Zone 2: CAUTION Call your doctor's office for any of the following:  . BP reading is greater than 140 (top number) or greater than 90 (bottom number)  . Stomach pain under your ribs in the middle or right side . Headaches or seeing spots . Feeling nauseated or throwing up . Swelling in face and hands  Zone 3: EMERGENCY  Seek immediate medical care if you have any of  the following:  . BP reading is greater than160 (top number) or greater than 110 (bottom number) . Severe headaches not improving with Tylenol . Serious difficulty catching your breath . Any worsening symptoms from Zone 2   Braxton Hicks Contractions Contractions of the uterus can occur throughout pregnancy, but they are not always a sign that you are in labor. You may have practice contractions called Braxton Hicks contractions. These false labor contractions are sometimes confused with true labor. What are Braxton Hicks contractions? Braxton Hicks contractions are tightening movements that occur in the muscles of the uterus before labor. Unlike true labor contractions, these  contractions do not result in opening (dilation) and thinning of the cervix. Toward the end of pregnancy (32-34 weeks), Braxton Hicks contractions can happen more often and may become stronger. These contractions are sometimes difficult to tell apart from true labor because they can be very uncomfortable. You should not feel embarrassed if you go to the hospital with false labor. Sometimes, the only way to tell if you are in true labor is for your health care provider to look for changes in the cervix. The health care provider will do a physical exam and may monitor your contractions. If you are not in true labor, the exam should show that your cervix is not dilating and your water has not broken. If there are no other health problems associated with your pregnancy, it is completely safe for you to be sent home with false labor. You may continue to have Braxton Hicks contractions until you go into true labor. How to tell the difference between true labor and false labor True labor  Contractions last 30-70 seconds.  Contractions become very regular.  Discomfort is usually felt in the top of the uterus, and it spreads to the lower abdomen and low back.  Contractions do not go away with walking.  Contractions usually become more intense and increase in frequency.  The cervix dilates and gets thinner. False labor  Contractions are usually shorter and not as strong as true labor contractions.  Contractions are usually irregular.  Contractions are often felt in the front of the lower abdomen and in the groin.  Contractions may go away when you walk around or change positions while lying down.  Contractions get weaker and are shorter-lasting as time goes on.  The cervix usually does not dilate or become thin. Follow these instructions at home:  1. Take over-the-counter and prescription medicines only as told by your health care provider. 2. Keep up with your usual exercises and follow other  instructions from your health care provider. 3. Eat and drink lightly if you think you are going into labor. 4. If Braxton Hicks contractions are making you uncomfortable: ? Change your position from lying down or resting to walking, or change from walking to resting. ? Sit and rest in a tub of warm water. ? Drink enough fluid to keep your urine pale yellow. Dehydration may cause these contractions. ? Do slow and deep breathing several times an hour. 5. Keep all follow-up prenatal visits as told by your health care provider. This is important. Contact a health care provider if:  You have a fever.  You have continuous pain in your abdomen. Get help right away if:  Your contractions become stronger, more regular, and closer together.  You have fluid leaking or gushing from your vagina.  You pass blood-tinged mucus (bloody show).  You have bleeding from your vagina.  You have low back   pain that you never had before.  You feel your baby's head pushing down and causing pelvic pressure.  Your baby is not moving inside you as much as it used to. Summary  Contractions that occur before labor are called Braxton Hicks contractions, false labor, or practice contractions.  Braxton Hicks contractions are usually shorter, weaker, farther apart, and less regular than true labor contractions. True labor contractions usually become progressively stronger and regular, and they become more frequent.  Manage discomfort from [facility] contractions by changing position, resting in a warm bath, drinking plenty of water, or practicing deep breathing. This information is not intended to replace advice given to you by your health care provider. Make sure you discuss any questions you have with your health care provider. Document Revised: 01/23/2017 Document Reviewed: 06/26/2016 Elsevier Patient Education  [place].

## 2020-04-26 NOTE — Progress Notes (Signed)
   LOW-RISK PREGNANCY VISIT Patient name: Erica Doyle MRN [id number]  Date of birth: 04-27-1995 Chief Complaint:   Routine Prenatal Visit  History of Present Illness:   Erica Doyle is a 25 y.o. G58P0000 female at [redacted]w[redacted]d with an Estimated Date of Delivery: 05/14/20 being seen today for ongoing management of a low-risk pregnancy.  Depression screen [facility] 2/9 02/08/2020 11/02/2019 04/06/2019  Decreased Interest 0 0 0  Down, Depressed, Hopeless 0 0 0  PHQ - 2 Score 0 0 0  Altered sleeping 0 0 -  Tired, decreased energy 0 1 -  Change in appetite 0 0 -  Feeling bad or failure about yourself  0 0 -  Trouble concentrating 0 0 -  Moving slowly or fidgety/restless 0 0 -  Suicidal thoughts 0 0 -  PHQ-9 Score 0 1 -    Today she reports no complaints. Contractions: Irritability. Vag. Bleeding: None.  Movement: Present. denies leaking of fluid. Review of Systems:   Pertinent items are noted in HPI Denies abnormal vaginal discharge w/ itching/odor/irritation, headaches, visual changes, shortness of breath, chest pain, abdominal pain, severe nausea/vomiting, or problems with urination or bowel movements unless otherwise stated above. Pertinent History Reviewed:  Reviewed past medical,surgical, social, obstetrical and family history.  Reviewed problem list, medications and allergies. Physical Assessment:   Vitals:   04/26/20 1110  BP: 134/85  Pulse: 97  Weight: 210 lb (95.3 kg)  Body mass index is 39.68 kg/m.        Physical Examination by Albertine Grates, SNP  General appearance: Well appearing, and in no distress  Mental status: Alert, oriented to person, place, and time  Skin: Warm & dry  Cardiovascular: Normal heart rate noted  Respiratory: Normal respiratory effort, no distress  Abdomen: Soft, gravid, nontender  Pelvic: Cervical exam performed  Dilation: Closed Effacement (%): Thick Station: -3  Extremities: Edema: Trace  Fetal Status: Fetal Heart Rate (bpm): 153 Fundal Height: 39 cm  Movement: Present Presentation: Vertex  Chaperone: Angel Neas   No results found for this or any previous visit (from the past 24 hour(s)).  Assessment & Plan:  1) Low-risk pregnancy G1P0000 at [redacted]w[redacted]d with an Estimated Date of Delivery: 05/14/20   2) Recent +CT, finish up meds, POC in 3-4wks   Meds: No orders of the defined types were placed in this encounter.  Labs/procedures today: SVE  Plan:  Continue routine obstetrical care  Next visit: prefers in person    Reviewed: Term labor symptoms and general obstetric precautions including but not limited to vaginal bleeding, contractions, leaking of fluid and fetal movement were reviewed in detail with the patient.  All questions were answered. Has home bp cuff. Check bp weekly, let us know if >140/90.   Follow-up: Return in about 1 week (around 05/03/2020) for LROB, CNM, in person.  No future appointments.  No orders of the defined types were placed in this encounter.  Cheral Marker CNM, [facility] 04/26/2020 11:25 AM

## 2020-05-03 ENCOUNTER — Other Ambulatory Visit: Payer: Self-pay

## 2020-05-03 ENCOUNTER — Ambulatory Visit (INDEPENDENT_AMBULATORY_CARE_PROVIDER_SITE_OTHER): Payer: Medicaid Other | Admitting: Obstetrics & Gynecology

## 2020-05-03 VITALS — BP 142/81 | HR 85 | Wt 212.0 lb

## 2020-05-03 DIAGNOSIS — Z1389 Encounter for screening for other disorder: Secondary | ICD-10-CM

## 2020-05-03 DIAGNOSIS — R030 Elevated blood-pressure reading, without diagnosis of hypertension: Secondary | ICD-10-CM

## 2020-05-03 DIAGNOSIS — Z3403 Encounter for supervision of normal first pregnancy, third trimester: Secondary | ICD-10-CM

## 2020-05-03 LAB — POCT URINALYSIS DIPSTICK OB
Blood, UA: NEGATIVE
Glucose, UA: NEGATIVE
Ketones, UA: NEGATIVE
Leukocytes, UA: NEGATIVE
Nitrite, UA: NEGATIVE
POC,PROTEIN,UA: NEGATIVE

## 2020-05-03 NOTE — Progress Notes (Signed)
LOW-RISK PREGNANCY VISIT Patient name: Erica Doyle MRN [id number]  Date of birth: Apr 04, 1995 Chief Complaint:   Routine Prenatal Visit  History of Present Illness:   Erica Doyle is a 25 y.o. G32P0000 female at [redacted]w[redacted]d with an Estimated Date of Delivery: 05/14/20 being seen today for ongoing management of a low-risk pregnancy.  Depression screen [facility] 2/9 02/08/2020 11/02/2019 04/06/2019  Decreased Interest 0 0 0  Down, Depressed, Hopeless 0 0 0  PHQ - 2 Score 0 0 0  Altered sleeping 0 0 -  Tired, decreased energy 0 1 -  Change in appetite 0 0 -  Feeling bad or failure about yourself  0 0 -  Trouble concentrating 0 0 -  Moving slowly or fidgety/restless 0 0 -  Suicidal thoughts 0 0 -  PHQ-9 Score 0 1 -    Today she reports no complaints. Contractions: Not present. Vag. Bleeding: None.  Movement: Present. denies leaking of fluid. Review of Systems:   Pertinent items are noted in HPI Denies abnormal vaginal discharge w/ itching/odor/irritation, headaches, visual changes, shortness of breath, chest pain, abdominal pain, severe nausea/vomiting, or problems with urination or bowel movements unless otherwise stated above. Pertinent History Reviewed:  Reviewed past medical,surgical, social, obstetrical and family history.  Reviewed problem list, medications and allergies. Physical Assessment:   Vitals:   05/03/20 1423 05/03/20 1424  BP: (!) 147/87 (!) 142/81  Pulse: 87 85  Weight: 212 lb (96.2 kg)   Body mass index is 40.06 kg/m.        Physical Examination:   General appearance: Well appearing, and in no distress  Mental status: Alert, oriented to person, place, and time  Skin: Warm & dry  Cardiovascular: Normal heart rate noted  Respiratory: Normal respiratory effort, no distress  Abdomen: Soft, gravid, nontender  Pelvic: Cervical exam performed  Dilation: Closed Effacement (%): Thick Station: -3  Extremities: Edema: Trace  Fetal Status: Fetal Heart Rate (bpm): 132 Fundal  Height: 40 cm Movement: Present Presentation: Vertex  Chaperone: Clint Bolder RN    Results for orders placed or performed in visit on 05/03/20 (from the past 24 hour(s))  POC Urinalysis Dipstick OB   Collection Time: 05/03/20  2:27 PM  Result Value Ref Range   Color, UA     Clarity, UA     Glucose, UA Negative Negative   Bilirubin, UA     Ketones, UA neg    Spec Grav, UA     Blood, UA neg    pH, UA     POC,PROTEIN,UA Negative Negative, Trace, Small (1+), Moderate (2+), Large (3+), 4+   Urobilinogen, UA     Nitrite, UA neg    Leukocytes, UA Negative Negative   Appearance     Odor      Assessment & Plan:  1) Low-risk pregnancy G1P0000 at [redacted]w[redacted]d with an Estimated Date of Delivery: 05/14/20   2) Borderline BP, first trimester DBP 81 and several 130s systolic, if remains elevated plan IOL, protein is negative no headache or visual changes   Meds: No orders of the defined types were placed in this encounter.  Labs/procedures today:   Plan:  Continue routine obstetrical care short interval follow up for BP recheck Next visit: prefers in person    Reviewed: Term labor symptoms and general obstetric precautions including but not limited to vaginal bleeding, contractions, leaking of fluid and fetal movement were reviewed in detail with the patient.  All questions were answered. Has home bp cuff.  Rx faxed to . Check bp weekly, let us know if >140/90.   Follow-up: Return in about 4 days (around 05/07/2020) for LROB.  Orders Placed This Encounter  Procedures  . POC Urinalysis Dipstick OB    Lazaro Arms, MD 05/03/2020 3:00 PM

## 2020-05-08 ENCOUNTER — Encounter (HOSPITAL_COMMUNITY): Payer: Self-pay | Admitting: Obstetrics and Gynecology

## 2020-05-08 ENCOUNTER — Other Ambulatory Visit: Payer: Self-pay

## 2020-05-08 ENCOUNTER — Inpatient Hospital Stay (HOSPITAL_COMMUNITY)
Admission: AD | Admit: 2020-05-08 | Discharge: 2020-05-11 | DRG: 807 | Disposition: A | Discharge status: Home / Self Care | Payer: Medicaid Other | Attending: Family Medicine | Admitting: Family Medicine

## 2020-05-08 ENCOUNTER — Encounter: Payer: Self-pay | Admitting: Women's Health

## 2020-05-08 ENCOUNTER — Ambulatory Visit (INDEPENDENT_AMBULATORY_CARE_PROVIDER_SITE_OTHER): Payer: Medicaid Other | Admitting: Women's Health

## 2020-05-08 VITALS — BP 145/92 | HR 95 | Wt 215.0 lb

## 2020-05-08 DIAGNOSIS — Z8619 Personal history of other infectious and parasitic diseases: Secondary | ICD-10-CM | POA: Diagnosis not present

## 2020-05-08 DIAGNOSIS — O99324 Drug use complicating childbirth: Secondary | ICD-10-CM | POA: Diagnosis present

## 2020-05-08 DIAGNOSIS — Z30017 Encounter for initial prescription of implantable subdermal contraceptive: Secondary | ICD-10-CM | POA: Diagnosis not present

## 2020-05-08 DIAGNOSIS — O133 Gestational [pregnancy-induced] hypertension without significant proteinuria, third trimester: Secondary | ICD-10-CM

## 2020-05-08 DIAGNOSIS — O139 Gestational [pregnancy-induced] hypertension without significant proteinuria, unspecified trimester: Secondary | ICD-10-CM | POA: Insufficient documentation

## 2020-05-08 DIAGNOSIS — O134 Gestational [pregnancy-induced] hypertension without significant proteinuria, complicating childbirth: Secondary | ICD-10-CM | POA: Diagnosis not present

## 2020-05-08 DIAGNOSIS — A749 Chlamydial infection, unspecified: Secondary | ICD-10-CM | POA: Diagnosis present

## 2020-05-08 DIAGNOSIS — Z3403 Encounter for supervision of normal first pregnancy, third trimester: Secondary | ICD-10-CM

## 2020-05-08 DIAGNOSIS — O09899 Supervision of other high risk pregnancies, unspecified trimester: Secondary | ICD-10-CM

## 2020-05-08 DIAGNOSIS — F1290 Cannabis use, unspecified, uncomplicated: Secondary | ICD-10-CM | POA: Diagnosis present

## 2020-05-08 DIAGNOSIS — O09299 Supervision of pregnancy with other poor reproductive or obstetric history, unspecified trimester: Secondary | ICD-10-CM

## 2020-05-08 DIAGNOSIS — Z3A39 39 weeks gestation of pregnancy: Secondary | ICD-10-CM | POA: Diagnosis not present

## 2020-05-08 DIAGNOSIS — Z87891 Personal history of nicotine dependence: Secondary | ICD-10-CM

## 2020-05-08 DIAGNOSIS — Z8759 Personal history of other complications of pregnancy, childbirth and the puerperium: Secondary | ICD-10-CM | POA: Diagnosis not present

## 2020-05-08 DIAGNOSIS — Z880 Allergy status to penicillin: Secondary | ICD-10-CM | POA: Diagnosis not present

## 2020-05-08 DIAGNOSIS — Z20822 Contact with and (suspected) exposure to covid-19: Secondary | ICD-10-CM | POA: Diagnosis not present

## 2020-05-08 DIAGNOSIS — O98819 Other maternal infectious and parasitic diseases complicating pregnancy, unspecified trimester: Secondary | ICD-10-CM | POA: Diagnosis present

## 2020-05-08 LAB — COMPREHENSIVE METABOLIC PANEL
ALT: 14 U/L (ref 0–44)
AST: 20 U/L (ref 15–41)
Albumin: 2.6 g/dL — ABNORMAL LOW (ref 3.5–5.0)
Alkaline Phosphatase: 159 U/L — ABNORMAL HIGH (ref 38–126)
Anion gap: 9 (ref 5–15)
BUN: <5 mg/dL — ABNORMAL LOW (ref 6–20)
CO2: 21 mmol/L — ABNORMAL LOW (ref 22–32)
Calcium: 9.1 mg/dL (ref 8.9–10.3)
Chloride: 107 mmol/L (ref 98–111)
Creatinine, Ser: 0.55 mg/dL (ref 0.44–1.00)
GFR, Estimated: >60 mL/min (ref >60)
Glucose, Bld: 109 mg/dL — ABNORMAL HIGH (ref 70–99)
Potassium: 3.5 mmol/L (ref 3.5–5.1)
Sodium: 137 mmol/L (ref 135–145)
Total Bilirubin: 1 mg/dL (ref 0.3–1.2)
Total Protein: 5.9 g/dL — ABNORMAL LOW (ref 6.5–8.1)

## 2020-05-08 LAB — CBC
HCT: 31.3 % — ABNORMAL LOW (ref 36.0–46.0)
Hemoglobin: 10.3 g/dL — ABNORMAL LOW (ref 12.0–15.0)
MCH: 27.5 pg (ref 26.0–34.0)
MCHC: 32.9 g/dL (ref 30.0–36.0)
MCV: 83.5 fL (ref 80.0–100.0)
Platelets: 274 10*3/uL (ref 150–400)
RBC: 3.75 MIL/uL — ABNORMAL LOW (ref 3.87–5.11)
RDW: 15.1 % (ref 11.5–15.5)
WBC: 12.1 10*3/uL — ABNORMAL HIGH (ref 4.0–10.5)
nRBC: 0 % (ref 0.0–0.2)

## 2020-05-08 LAB — POCT URINALYSIS DIPSTICK OB
Blood, UA: NEGATIVE
Glucose, UA: NEGATIVE
Ketones, UA: NEGATIVE
Leukocytes, UA: NEGATIVE
Nitrite, UA: NEGATIVE
POC,PROTEIN,UA: NEGATIVE

## 2020-05-08 LAB — TYPE AND SCREEN
ABO/RH(D): A POS
Antibody Screen: NEGATIVE

## 2020-05-08 LAB — PROTEIN / CREATININE RATIO, URINE
Creatinine, Urine: 308.98 mg/dL
Protein Creatinine Ratio: 0.11 mg/mg{Cre} (ref 0.00–0.15)
Total Protein, Urine: 33 mg/dL

## 2020-05-08 LAB — RESP PANEL BY RT-PCR (FLU A&B, COVID) ARPGX2
Influenza A by PCR: NEGATIVE
Influenza B by PCR: NEGATIVE
SARS Coronavirus 2 by RT PCR: NEGATIVE

## 2020-05-08 MED ORDER — FENTANYL CITRATE (PF) 100 MCG/2ML IJ SOLN
100.0000 ug | INTRAMUSCULAR | Status: DC | PRN
  Administered 2020-05-09 (×2): 100 ug via INTRAVENOUS
  Filled 2020-05-08 (×2): qty 2

## 2020-05-08 MED ORDER — MISOPROSTOL 50MCG HALF TABLET
50.0000 ug | ORAL_TABLET | ORAL | Status: DC | PRN
  Administered 2020-05-08: 50 ug via BUCCAL
  Filled 2020-05-08: qty 1

## 2020-05-08 MED ORDER — OXYCODONE-ACETAMINOPHEN 5-325 MG PO TABS: 1.0000 | ORAL_TABLET | ORAL | Status: DC | PRN

## 2020-05-08 MED ORDER — OXYTOCIN-SODIUM CHLORIDE 30-0.9 UT/500ML-% IV SOLN
2.5000 [IU]/h | INTRAVENOUS | Status: DC
  Administered 2020-05-09: 2.5 [IU]/h via INTRAVENOUS
  Filled 2020-05-08: qty 500

## 2020-05-08 MED ORDER — LIDOCAINE HCL (PF) 1 % IJ SOLN
30.0000 mL | INTRAMUSCULAR | Status: AC | PRN
  Administered 2020-05-09: 30 mL via SUBCUTANEOUS
  Filled 2020-05-08: qty 30

## 2020-05-08 MED ORDER — LACTATED RINGERS IV SOLN: INTRAVENOUS | Status: DC

## 2020-05-08 MED ORDER — ACETAMINOPHEN 325 MG PO TABS: 650.0000 mg | ORAL_TABLET | ORAL | Status: DC | PRN

## 2020-05-08 MED ORDER — SOD CITRATE-CITRIC ACID 500-334 MG/5ML PO SOLN: 30.0000 mL | ORAL | Status: DC | PRN

## 2020-05-08 MED ORDER — OXYCODONE-ACETAMINOPHEN 5-325 MG PO TABS: 2.0000 | ORAL_TABLET | ORAL | Status: DC | PRN

## 2020-05-08 MED ORDER — OXYTOCIN BOLUS FROM INFUSION
333.0000 mL | Freq: Once | INTRAVENOUS | Status: AC
  Administered 2020-05-09: 333 mL via INTRAVENOUS

## 2020-05-08 MED ORDER — ONDANSETRON HCL 4 MG/2ML IJ SOLN
4.0000 mg | Freq: Four times a day (QID) | INTRAMUSCULAR | Status: DC | PRN
  Administered 2020-05-09: 4 mg via INTRAVENOUS
  Filled 2020-05-08: qty 2

## 2020-05-08 MED ORDER — LACTATED RINGERS IV SOLN: 500.0000 mL | INTRAVENOUS | Status: DC | PRN

## 2020-05-08 MED ORDER — TERBUTALINE SULFATE 1 MG/ML IJ SOLN: 0.2500 mg | Freq: Once | INTRAMUSCULAR | Status: DC | PRN

## 2020-05-08 NOTE — H&P (Addendum)
OBSTETRIC ADMISSION HISTORY AND PHYSICAL  Erica Doyle is a 25 y.o. female G1P0000 with IUP at [redacted]w[redacted]d by LMP+8wk u/s presenting for IOL for gHTN. She reports +FMs, No LOF, no VB, no blurry vision, headaches or peripheral edema, and RUQ pain.  She plans on breast feeding. She is considering nexplanon for birth control. She received her prenatal [facility] at [facility]   Dating: By 8 wk u/s  --->  Estimated Date of Delivery: 05/14/20  Sono:    @[redacted]w[redacted]d , CWD, normal anatomy, cephalic  presentation, posterior placenta, 3095g, 72% EFW   Prenatal History/Complications: gHTN  Past Medical History: Past Medical History:  Diagnosis Date  . Medical history non-contributory   . Nexplanon insertion 12/31/2012   nexplanon inserted left arm 12/31/12 remove 01/01/16  . Trichimoniasis 04/07/2019   Treated 04/07/19,  Poc____________    Past Surgical History: Past Surgical History:  Procedure Laterality Date  . KNEE SURGERY Left     Obstetrical History: OB History    Gravida  1   Para  0   Term  0   Preterm  0   AB  0   Living  0     SAB  0   IAB  0   Ectopic  0   Multiple  0   Live Births  0           Social History Social History   Socioeconomic History  . Marital status: Single    Spouse name: Not on file  . Number of children: Not on file  . Years of education: Not on file  . Highest education level: Not on file  Occupational History  . Not on file  Tobacco Use  . Smoking status: Former 06/05/19  . Smokeless tobacco: Never Used  Vaping Use  . Vaping Use: Never used  Substance and Sexual Activity  . Alcohol use: No  . Drug use: No  . Sexual activity: Yes    Birth control/protection: Condom  Other Topics Concern  . Not on file  Social History Narrative  . Not on file   Social Determinants of Health   Financial Resource Strain: Low Risk   . Difficulty of Paying Living Expenses: Not hard at all  Food Insecurity: No Food Insecurity  . Worried About Games developer in the Last Year: Never true  . Ran Out of Food in the Last Year: Never true  Transportation Needs: No Transportation Needs  . Lack of Transportation (Medical): No  . Lack of Transportation (Non-Medical): No  Physical Activity: Insufficiently Active  . Days of Exercise per Week: 3 days  . Minutes of Exercise per Session: 20 min  Stress: No Stress Concern Present  . Feeling of Stress : Not at all  Social Connections: Moderately Isolated  . Frequency of Communication with Friends and Family: More than three times a week  . Frequency of Social Gatherings with Friends and Family: Twice a week  . Attends Religious Services: 1 to 4 times per year  . Active Member of Clubs or Organizations: No  . Attends Community education officer Meetings: Never  . Marital Status: Never married    Family History: Family History  Problem Relation Age of Onset  . Hypertension Maternal Grandmother   . Hypertension Paternal Grandmother     Allergies: Allergies  Allergen Reactions  . Penicillins Other (See Comments)    Has patient had a PCN reaction causing immediate rash, facial/tongue/throat swelling, SOB or lightheadedness with hypotension: No  Has patient had a PCN reaction causing severe rash involving mucus membranes or skin necrosis: No Has patient had a PCN reaction that required hospitalization: Yes Has patient had a PCN reaction occurring within the last 10 years: No If all of the above answers are "NO", then may proceed with Cephalosporin use.     Medications Prior to Admission  Medication Sig Dispense Refill Last Dose  . pantoprazole (PROTONIX) 20 MG tablet Take 1 tablet (20 mg total) by mouth daily. 30 tablet 3   . Prenatal Vit-Iron Carbonyl-FA (PRENATAL PLUS IRON) 29-1 MG TABS Take 1 daily 30 tablet 12   . PROVENTIL HFA 108 (90 BASE) MCG/ACT inhaler Inhale 2 puffs into the lungs every 4 (four) hours as needed for wheezing or shortness of breath.   1      Review of Systems   All  systems reviewed and negative except as stated in HPI  Blood pressure 112/77, pulse 93, temperature 98.5 F (36.9 C), temperature source Oral, resp. rate 16, height 5\' 1"  (1.549 m), weight 98.2 kg, last menstrual period 08/08/2019. General appearance: alert, cooperative, appears stated age and no distress Lungs: clear to auscultation bilaterally Heart: regular rate and rhythm Abdomen: soft, non-tender; bowel sounds normal Extremities: Homans sign is negative, no sign of DVT Presentation: cephalic Fetal monitoringBaseline: 150 bpm, Variability: Good {> 6 bpm), Accelerations: Reactive and Decelerations: Absent Uterine activityFrequency: Every 2-3 minutes Dilation: Closed Effacement (%): Thick Station: -3 Exam by:: [email or web address]   Prenatal labs: ABO, Rh: --/--/PENDING (03/15 1531) Antibody: PENDING (03/15 1531) Rubella: 6.16 (09/08 1225) RPR: Non Reactive (12/15 0846)  HBsAg: Negative (09/08 1225)  HIV: Non Reactive (12/15 0846)  GBS: --06-10-2002 (02/23 1200)  1 hr Glucola normal Genetic screening  Panorama low risk  Anatomy 08-04-1984 normal female   Prenatal Transfer Tool  Maternal Diabetes: No Genetic Screening: Normal Maternal Ultrasounds/Referrals: Normal Fetal Ultrasounds or other Referrals:  None Maternal Substance Abuse:  No Significant Maternal Medications:  None Significant Maternal Lab Results: Group B Strep negative  Results for orders placed or performed during the hospital encounter of 05/08/20 (from the past 24 hour(s))  Type and screen [facility]   Collection Time: 05/08/20  3:31 PM  Result Value Ref Range   ABO/RH(D) PENDING    Antibody Screen PENDING    Sample Expiration      05/11/2020,2359 Performed at [facility], [address], [place], [place]   Results for orders placed or performed in visit on 05/08/20 (from the past 24 hour(s))  POC Urinalysis Dipstick OB   Collection Time: 05/08/20 11:12 AM  Result Value Ref Range    Color, UA     Clarity, UA     Glucose, UA Negative Negative   Bilirubin, UA     Ketones, UA neg    Spec Grav, UA     Blood, UA neg    pH, UA     POC,PROTEIN,UA Negative Negative, Trace, Small (1+), Moderate (2+), Large (3+), 4+   Urobilinogen, UA     Nitrite, UA neg    Leukocytes, UA Negative Negative   Appearance     Odor      Patient Active Problem List   Diagnosis Date Noted  . Gestational hypertension 05/08/2020  . Chlamydia infection affecting pregnancy 04/20/2020  . Marijuana use 11/07/2019  . Supervision of normal first pregnancy 11/01/2019  . Trichimoniasis 04/07/2019    Assessment/Plan:  Janisa L Fahrney is a 25 y.o. G1P0000 at  [redacted]w[redacted]d here for IOL for gHTN.   #Labor:IOL with 50 mcg cytotec. Cervix closed at this time and will plan for FB with next check if able.  #Pain: IV pain meds PRN  #FWB: Cat 1, reassuring  #ID:  GBS neg, TOC Chlamydia pending.  #MOF: Breast  #MOC:Nexplanon  #gHTN: BP normal on arrival. Pre-E labs pending. Will continue to monitor and patient will need 1 week BP check after delivery.  Derrel Nip, MD  05/08/2020, 4:09 PM  CNM attestation:  I have seen and examined this patient; I agree with above documentation in the resident's note.   Rodney L Brendel is a 25 y.o. G1P0000 here for IOL due to dx of gHTN during her office visit today (previously elevated BPs on 05/03/20). Denies s/s pre-e.  PE: BP 125/84   Pulse 87   Temp 98.5 F (36.9 C) (Oral)   Resp 17   Ht 5\' 1"  (1.549 m)   Wt 98.2 kg   LMP 08/08/2019 (Exact Date)   BMI 40.93 kg/m  Gen: calm comfortable, NAD Resp: normal effort, no distress Abd: gravid  ROS, labs, PMH reviewed  Plan: Admit to L&D Collect pre-e labs and chlamydia TOC as well Plan cx ripening with cytotec to start and cervical foley when able; Pit/AROM prn Continue to monitor BPs/symptoms Anticipate vag del  08/10/2019 CNM 05/08/2020, 6:30 PM

## 2020-05-08 NOTE — Progress Notes (Signed)
Patient ID: Erica Doyle, female   DOB: [birth year], 25 y.o.   MRN: [id number] Doing well  Describes UCs as mild  Vitals:   05/08/20 1659 05/08/20 1824 05/08/20 1915 05/08/20 1921  BP: 125/84 126/74 127/81   Pulse: 87 84 89   Resp: 17 16 17    Temp:  98.4 F (36.9 C)  98.7 F (37.1 C)  TempSrc:  Axillary  Oral  Weight:      Height:       FHR reassuring UCs frequent, tachysystole  Dilation: 1 Effacement (%): 70 Station: -3 Presentation: Vertex Exam by:: Dr. [email or web address], MD  Foley placed by Day Team  Will hold Cytotec and observe for now

## 2020-05-08 NOTE — Progress Notes (Signed)
LABOR PROGRESS NOTE  Erica Doyle is a 25 y.o. G1P0000 at [redacted]w[redacted]d  admitted for IOL for gHTN.   Subjective: Patient reports she can feel the contractions but is not uncomfortable.   Objective: BP 127/81   Pulse 89   Temp 98.7 F (37.1 C) (Oral)   Resp 17   Ht 5\' 1"  (1.549 m)   Wt 98.2 kg   LMP 08/08/2019 (Exact Date)   BMI 40.93 kg/m  or  Vitals:   05/08/20 1659 05/08/20 1824 05/08/20 1915 05/08/20 1921  BP: 125/84 126/74 127/81   Pulse: 87 84 89   Resp: 17 16 17    Temp:  98.4 F (36.9 C)  98.7 F (37.1 C)  TempSrc:  Axillary  Oral  Weight:      Height:       Dilation: Closed Effacement (%): Thick Station: -3 Presentation: Vertex Exam by:: FHT: baseline rate 145, moderate varibility, + acel, no decel Toco: every 2-3 min   Labs: Lab Results  Component Value Date   WBC 12.1 (H) 05/08/2020   HGB 10.3 (L) 05/08/2020   HCT 31.3 (L) 05/08/2020   MCV 83.5 05/08/2020   PLT 274 05/08/2020    Patient Active Problem List   Diagnosis Date Noted  . Gestational hypertension 05/08/2020  . Chlamydia infection affecting pregnancy 04/20/2020  . Marijuana use 11/07/2019  . Supervision of normal first pregnancy 11/01/2019  . Trichimoniasis 04/07/2019    Assessment / Plan: 25 y.o. G1P0000 at [redacted]w[redacted]d here for IOL for gHTN   Labor: s/p cytotec x 1. Placed foley bulb on this check. Will give patient additional 50 mcg buccal cytotec.  Fetal Wellbeing:  Cat 1, reassuring  Pain Control:  IV pain meds  Anticipated MOD:  Vaginal  GHTN: normotensive since arrival, continue to monitor.    30, MD  PGY-2, [facility]  05/08/2020, 7:55 PM

## 2020-05-08 NOTE — Progress Notes (Signed)
HIGH-RISK PREGNANCY VISIT Patient name: Erica Doyle MRN [id number]  Date of birth: [birth year] Chief Complaint:   Routine Prenatal Visit  History of Present Illness:   Erica Doyle is a 25 y.o. G61P0000 female at [redacted]w[redacted]d with an Estimated Date of Delivery: 05/14/20 being seen today for ongoing management of a high-risk pregnancy complicated by gestational hypertension currently on no meds- dx today.  Today she reports no complaints. Denies ha, visual changes, ruq/epigastric pain, n/v.   Depression screen [facility] 2/9 02/08/2020 11/02/2019 04/06/2019  Decreased Interest 0 0 0  Down, Depressed, Hopeless 0 0 0  PHQ - 2 Score 0 0 0  Altered sleeping 0 0 -  Tired, decreased energy 0 1 -  Change in appetite 0 0 -  Feeling bad or failure about yourself  0 0 -  Trouble concentrating 0 0 -  Moving slowly or fidgety/restless 0 0 -  Suicidal thoughts 0 0 -  PHQ-9 Score 0 1 -    Contractions: Not present. Vag. Bleeding: None.  Movement: Present. denies leaking of fluid.  Review of Systems:   Pertinent items are noted in HPI Denies abnormal vaginal discharge w/ itching/odor/irritation, headaches, visual changes, shortness of breath, chest pain, abdominal pain, severe nausea/vomiting, or problems with urination or bowel movements unless otherwise stated above. Pertinent History Reviewed:  Reviewed past medical,surgical, social, obstetrical and family history.  Reviewed problem list, medications and allergies. Physical Assessment:   Vitals:   05/08/20 1108 05/08/20 1113  BP: (!) 147/91 (!) 145/92  Pulse: 90 95  Weight: 215 lb (97.5 kg)   Body mass index is 40.62 kg/m.           Physical Examination:   General appearance: alert, well appearing, and in no distress  Mental status: alert, oriented to person, place, and time  Skin: warm & dry   Extremities: Edema: Trace    Cardiovascular: normal heart rate noted  Respiratory: normal respiratory effort, no distress  Abdomen: gravid, soft,  non-tender  Pelvic: Cervical exam deferred         Fetal Status: Fetal Heart Rate (bpm): 140 Fundal Height: 39 cm Movement: Present Presentation: Vertex  Fetal Surveillance Testing today: doppler   Chaperone: N/A    Results for orders placed or performed in visit on 05/08/20 (from the past 24 hour(s))  POC Urinalysis Dipstick OB   Collection Time: 05/08/20 11:12 AM  Result Value Ref Range   Color, UA     Clarity, UA     Glucose, UA Negative Negative   Bilirubin, UA     Ketones, UA neg    Spec Grav, UA     Blood, UA neg    pH, UA     POC,PROTEIN,UA Negative Negative, Trace, Small (1+), Moderate (2+), Large (3+), 4+   Urobilinogen, UA     Nitrite, UA neg    Leukocytes, UA Negative Negative   Appearance     Odor      Assessment & Plan:  High-risk pregnancy: G1P0000 at [redacted]w[redacted]d with an Estimated Date of Delivery: 05/14/20   1) GHTN, dx today, no proteinuria, asymptomatic. To L&D for direct admit/IOL. Notifed L&D charge and sent note to L&D providers.   Meds: No orders of the defined types were placed in this encounter.  Labs/procedures today: none  Treatment Plan:  To L&D  Follow-up: Return for will schedule bp check pp.   Future Appointments  Date Time Provider Department Center  06/12/2020  9:10 AM Cheral Marker, CNM  [facility]    Orders Placed This Encounter  Procedures  . GC/Chlamydia Probe Amp  . POC Urinalysis Dipstick OB   Cheral Marker CNM, [facility] 05/08/2020 11:30 AM

## 2020-05-09 ENCOUNTER — Encounter (HOSPITAL_COMMUNITY): Payer: Self-pay | Admitting: Obstetrics and Gynecology

## 2020-05-09 DIAGNOSIS — Z3A39 39 weeks gestation of pregnancy: Secondary | ICD-10-CM

## 2020-05-09 LAB — RPR: RPR Ser Ql: NONREACTIVE

## 2020-05-09 MED ORDER — PRENATAL MULTIVITAMIN CH
1.0000 | ORAL_TABLET | Freq: Every day | ORAL | Status: DC
  Administered 2020-05-10 – 2020-05-11 (×2): 1 via ORAL
  Filled 2020-05-09 (×2): qty 1

## 2020-05-09 MED ORDER — COCONUT OIL OIL: 1.0000 "application " | TOPICAL_OIL | Status: DC | PRN

## 2020-05-09 MED ORDER — ONDANSETRON HCL 4 MG PO TABS: 4.0000 mg | ORAL_TABLET | ORAL | Status: DC | PRN

## 2020-05-09 MED ORDER — ZOLPIDEM TARTRATE 5 MG PO TABS
5.0000 mg | ORAL_TABLET | Freq: Every evening | ORAL | Status: DC | PRN
Start: 2020-05-09 — End: 2020-05-11

## 2020-05-09 MED ORDER — MEDROXYPROGESTERONE ACETATE 150 MG/ML IM SUSP: 150.0000 mg | INTRAMUSCULAR | Status: DC | PRN

## 2020-05-09 MED ORDER — IBUPROFEN 600 MG PO TABS
600.0000 mg | ORAL_TABLET | Freq: Four times a day (QID) | ORAL | Status: DC
  Administered 2020-05-09 – 2020-05-11 (×7): 600 mg via ORAL
  Filled 2020-05-09 (×7): qty 1

## 2020-05-09 MED ORDER — FERROUS SULFATE 325 (65 FE) MG PO TABS
325.0000 mg | ORAL_TABLET | ORAL | Status: DC
  Administered 2020-05-10: 325 mg via ORAL
  Filled 2020-05-09: qty 1

## 2020-05-09 MED ORDER — ONDANSETRON HCL 4 MG/2ML IJ SOLN: 4.0000 mg | INTRAMUSCULAR | Status: DC | PRN

## 2020-05-09 MED ORDER — SODIUM CHLORIDE 0.9 % IV SOLN
12.5000 mg | Freq: Four times a day (QID) | INTRAVENOUS | Status: DC | PRN
  Filled 2020-05-09 (×2): qty 0.5

## 2020-05-09 MED ORDER — BENZOCAINE-MENTHOL 20-0.5 % EX AERO
1.0000 | INHALATION_SPRAY | CUTANEOUS | Status: DC | PRN
Start: 2020-05-09 — End: 2020-05-11

## 2020-05-09 MED ORDER — SIMETHICONE 80 MG PO CHEW: 80.0000 mg | CHEWABLE_TABLET | ORAL | Status: DC | PRN

## 2020-05-09 MED ORDER — ACETAMINOPHEN 325 MG PO TABS: 650.0000 mg | ORAL_TABLET | ORAL | Status: DC | PRN

## 2020-05-09 MED ORDER — SODIUM CHLORIDE 0.9 % IV SOLN
12.5000 mg | Freq: Four times a day (QID) | INTRAVENOUS | Status: DC | PRN
  Filled 2020-05-09: qty 0.5

## 2020-05-09 MED ORDER — WITCH HAZEL-GLYCERIN EX PADS: 1.0000 "application " | MEDICATED_PAD | CUTANEOUS | Status: DC | PRN

## 2020-05-09 MED ORDER — DIBUCAINE (PERIANAL) 1 % EX OINT: 1.0000 "application " | TOPICAL_OINTMENT | CUTANEOUS | Status: DC | PRN

## 2020-05-09 MED ORDER — OXYTOCIN-SODIUM CHLORIDE 30-0.9 UT/500ML-% IV SOLN
1.0000 m[IU]/min | INTRAVENOUS | Status: DC
  Administered 2020-05-09: 2 m[IU]/min via INTRAVENOUS
  Filled 2020-05-09: qty 500

## 2020-05-09 MED ORDER — SENNOSIDES-DOCUSATE SODIUM 8.6-50 MG PO TABS
2.0000 | ORAL_TABLET | Freq: Every day | ORAL | Status: DC
  Administered 2020-05-10: 2 via ORAL
  Filled 2020-05-09 (×2): qty 2

## 2020-05-09 MED ORDER — TETANUS-DIPHTH-ACELL PERTUSSIS 5-2.5-18.5 LF-MCG/0.5 IM SUSY: 0.5000 mL | PREFILLED_SYRINGE | Freq: Once | INTRAMUSCULAR | Status: DC

## 2020-05-09 MED ORDER — TERBUTALINE SULFATE 1 MG/ML IJ SOLN: 0.2500 mg | Freq: Once | INTRAMUSCULAR | Status: DC | PRN

## 2020-05-09 MED ORDER — DIPHENHYDRAMINE HCL 25 MG PO CAPS: 25.0000 mg | ORAL_CAPSULE | Freq: Four times a day (QID) | ORAL | Status: DC | PRN

## 2020-05-09 NOTE — Progress Notes (Addendum)
LABOR PROGRESS NOTE  Erica Doyle is a 25 y.o. G1P0000 at [redacted]w[redacted]d admitted for IOL for gHTN.   Subjective: Patient doing well overall-- comfortable on birthing ball. No headache/vision changes/RUQ pain or other complaints.  Objective: BP 129/85   Pulse 84   Temp (!) 97.5 F (36.4 C) (Oral)   Resp 18   Ht 5\' 1"  (1.549 m)   Wt 98.2 kg   LMP 08/08/2019 (Exact Date)   BMI 40.93 kg/m  or  Vitals:   05/09/20 0700 05/09/20 0730 05/09/20 0800 05/09/20 0830  BP: 110/70 112/72 125/75 129/85  Pulse: 83 76 81 84  Resp: 16 18    Temp: (!) 97.5 F (36.4 C)     TempSrc: Oral     Weight:      Height:        Dilation: 5 Effacement (%): 50 Cervical Position: Middle Station: -1 Presentation: Vertex Exam by:: Dr [email or web address]: baseline rate 150, moderate varibility, + acel, no decels Toco: somewhat difficult to trace due to maternal movement, q2-7 min  Labs: Lab Results  Component Value Date   WBC 12.1 (H) 05/08/2020   HGB 10.3 (L) 05/08/2020   HCT 31.3 (L) 05/08/2020   MCV 83.5 05/08/2020   PLT 274 05/08/2020    Patient Active Problem List   Diagnosis Date Noted  . Gestational hypertension 05/08/2020  . Chlamydia infection affecting pregnancy 04/20/2020  . Marijuana use 11/07/2019  . Supervision of normal first pregnancy 11/01/2019  . Trichimoniasis 04/07/2019    Assessment / Plan: 25 y.o. G1P0000 at [redacted]w[redacted]d here for IOL due to gHTN.  IOL: s/p cytotec x1 and FB. AROM performed at 0920 for moderate amount of clear fluid. IUPC placed at that time-- most recent MVUs 160 (inadequate). Currently on pit 19mu/min-- will titrate accordingly. Most recent cervical exam 5/50/-1. gHTN: Has been normotensive with few mildly elevated BPs-- most recent reading 129/85. Will continue to monitor. Asymptomatic. Pre-E labs negative on admission (UP:C 0.11).   Fetal Wellbeing:  Cat I strip Pain Control:  Prn per patient request, does not desire epidural Anticipated MOD:  Vaginal Chlamydia:  Tested positive at 36 weeks. TOC collected on admission and is pending. Will f/u results   13m, MD PGY-1 [facility] 05/09/2020, 9:20 AM

## 2020-05-09 NOTE — Progress Notes (Signed)
Patient ID: Erica Doyle, female   DOB: Jul 15, 1995, 25 y.o.   MRN: [id number] Still pretty comfortable States UCs feel a little more painful  Vitals:   05/09/20 0201 05/09/20 0231 05/09/20 0302 05/09/20 0330  BP: 114/61 111/62 114/63 121/64  Pulse: 72 71 78 74  Resp: 16     Temp: 97.9 F (36.6 C)     TempSrc: Oral     Weight:      Height:       FHR reassuring, category 1 UCs every 2-4 min  Dilation: 4.5 Effacement (%): 60,70 Cervical Position: Middle Station: -2 Presentation: Vertex Exam by:: Aamira Bischoff CNM  Cervix is still a bit thick, but with frequency of UCs and hx tachysystole, will proceed with Pitocin rather than more cytotec

## 2020-05-09 NOTE — Discharge Instructions (Signed)

## 2020-05-09 NOTE — Lactation Note (Signed)
This note was copied from a baby's chart. Lactation Consultation Note  Patient Name: Erica Doyle Today's Date: 05/09/2020 Reason for consult: L&D Initial assessment;Mother's request;Primapara;1st time breastfeeding;Term;Other (Comment) (GHTN, Nexplanon insertion) Age:24 hours  On arrival infant showing cues. LC assisted latching infant on the left breast with signs of transfer.  LC reviewed feeding cues. Mom to receive further LC support on the floor.   Maternal Data Has patient been taught Hand Expression?: Yes Does the patient have breastfeeding experience prior to this delivery?: No  Feeding Mother's Current Feeding Choice: Breast Milk  LATCH Score Latch: Repeated attempts needed to sustain latch, nipple held in mouth throughout feeding, stimulation needed to elicit sucking reflex.  Audible Swallowing: Spontaneous and intermittent  Type of Nipple: Everted at rest and after stimulation  Comfort (Breast/Nipple): Soft / non-tender  Hold (Positioning): Assistance needed to correctly position infant at breast and maintain latch.  LATCH Score: 8   Lactation Tools Discussed/Used    Interventions Interventions: Breast feeding basics reviewed;Breast compression;Assisted with latch;Adjust position;Skin to skin;Support pillows;Breast massage;Hand express;Expressed milk;Education  Discharge    Consult Status Consult Status: Follow-up Date: 05/10/20 Follow-up type: In-patient    Desarai Barrack  Nicholson-Springer 05/09/2020, 6:44 PM

## 2020-05-09 NOTE — Progress Notes (Signed)
Patient ID: Erica Doyle, female   DOB: 04/28/1995, 25 y.o.   MRN: [id number] Doing well, tachysystole has settled down Vitals:   05/08/20 1915 05/08/20 1921 05/08/20 2318 05/08/20 2319  BP: 127/81   126/66  Pulse: 89   73  Resp: 17     Temp:  98.7 F (37.1 C) 98.4 F (36.9 C)   TempSrc:  Oral Oral   Weight:      Height:       FHR reassuring UCs q 1-12min  Foley balloon just fell out  Will have RN check cervix and start Low Dose Pitocin protocol

## 2020-05-09 NOTE — Progress Notes (Signed)
LABOR PROGRESS NOTE  Erica Doyle is a 25 y.o. G1P0000 at [redacted]w[redacted]d admitted for IOL for gHTN.   Subjective: Strip note only  Objective: BP 128/74   Pulse 74   Temp 98.9 F (37.2 C) (Axillary)   Resp 16   Ht 5\' 1"  (1.549 m)   Wt 98.2 kg   LMP 08/08/2019 (Exact Date)   BMI 40.93 kg/m  or  Vitals:   05/09/20 1001 05/09/20 1031 05/09/20 1105 05/09/20 1300  BP: (!) 130/100 123/70 118/61 128/74  Pulse: 78 81 72 74  Resp:  16    Temp:      TempSrc:      Weight:      Height:        Dilation: 6 Effacement (%): 60 Cervical Position: Middle Station: -2 Presentation: Vertex Exam by:: [email or web address] RN FHT: baseline rate 130, moderate varibility, + acel, no decels Toco: q2-4 min, 210 MVUs   Labs: Lab Results  Component Value Date   WBC 12.1 (H) 05/08/2020   HGB 10.3 (L) 05/08/2020   HCT 31.3 (L) 05/08/2020   MCV 83.5 05/08/2020   PLT 274 05/08/2020    Patient Active Problem List   Diagnosis Date Noted  . Gestational hypertension 05/08/2020  . Chlamydia infection affecting pregnancy 04/20/2020  . Marijuana use 11/07/2019  . Supervision of normal first pregnancy 11/01/2019  . Trichimoniasis 04/07/2019    Assessment / Plan: 25 y.o. G1P0000 at [redacted]w[redacted]d here for IOL due to gHTN.  IOL: s/p cytotec x1, FB, and AROM. IUPC in place. Currently on pit 45mu/min with adequate MVUs. Most recent cervical exam 6/60/-2. gHTN: Majority of BP readings have been normotensive since admission. Did have one elevation to 130/100 at 1000 this morning. Asymptomatic. Pre-E labs negative on admission (UP:C 0.11). Will continue to monitor Fetal Wellbeing:  Cat I strip Pain Control:  Prn per patient request, does not desire epidural Anticipated MOD:  Vaginal Chlamydia: Tested positive at 36 weeks. TOC collected on admission and is pending. Will f/u results   30m, MD PGY-1 [facility] 05/09/2020, 1:54 PM

## 2020-05-09 NOTE — Discharge Summary (Signed)
Postpartum Discharge Summary  Date of Service updated 05/10/20   Patient Name: Erica Doyle DOB: Jun 05, 1995 MRN: [id number]  Date of admission: 05/08/2020 Delivery date:05/09/2020  Delivering provider: Julianne Handler  Date of discharge: 05/10/2020  Admitting diagnosis: Gestational hypertension [O13.9] Intrauterine pregnancy: [redacted]w[redacted]d    Secondary diagnosis:  Active Problems:   Marijuana use   Chlamydia infection affecting pregnancy   Gestational hypertension   Vaginal delivery   Second degree perineal laceration  Additional problems: none    Discharge diagnosis: Term Pregnancy Delivered and Gestational Hypertension                                              Post partum procedures:Nexplanon Augmentation: AROM, Pitocin, Cytotec and IP Foley Complications: None  Hospital course: Induction of Labor With Vaginal Delivery   25y.o. yo G1P0000 at 322w2das admitted to the hospital 05/08/2020 for induction of labor.  Indication for induction: Gestational hypertension.  Patient had an uncomplicated labor course as follows: Membrane Rupture Time/Date: 7:50 AM ,05/09/2020   Delivery Method:Vaginal, Spontaneous  Episiotomy: None  Lacerations:  2nd degree;Perineal;Periurethral  Details of delivery can be found in separate delivery note.  Patient had a routine postpartum course. Patient is discharged home 05/10/20.  Newborn Data: Birth date:05/09/2020  Birth time:5:47 PM  Gender:Female  Living status:Living  Apgars:3 ,7  Weight:3300 g   Magnesium Sulfate received: No BMZ received: No Rhophylac:N/A MMR:N/A T-DaP:Given prenatally Flu: Yes Transfusion:No  Physical exam  Vitals:   05/09/20 2010 05/09/20 2130 05/10/20 0130 05/10/20 0530  BP: 132/72 129/68 122/64 127/65  Pulse: 75 82 79 78  Resp: _0 Temp: 98.7 F (37.1 C) 99.4 F (37.4 C) 98 F (36.7 C) 98 F (36.7 C)  TempSrc: Oral Oral Oral   SpO2: 99% 98% 98% 97%  Weight:      Height:       General:  alert Lochia: appropriate Uterine Fundus: firm Incision: N/A DVT Evaluation: No evidence of DVT seen on physical exam. Labs: Lab Results  Component Value Date   WBC 12.1 (H) 05/08/2020   HGB 10.3 (L) 05/08/2020   HCT 31.3 (L) 05/08/2020   MCV 83.5 05/08/2020   PLT 274 05/08/2020   CMP Latest Ref Rng & Units 05/08/2020  Glucose 70 - 99 mg/dL 109(H)  BUN 6 - 20 mg/dL <5(L)  Creatinine 0.44 - 1.00 mg/dL 0.55  Sodium 135 - 145 mmol/L 137  Potassium 3.5 - 5.1 mmol/L 3.5  Chloride 98 - 111 mmol/L 107  CO2 22 - 32 mmol/L 21(L)  Calcium 8.9 - 10.3 mg/dL 9.1  Total Protein 6.5 - 8.1 g/dL 5.9(L)  Total Bilirubin 0.3 - 1.2 mg/dL 1.0  Alkaline Phos 38 - 126 U/L 159(H)  AST 15 - 41 U/L 20  ALT 0 - 44 U/L 14   Edinburgh Score: No flowsheet data found.   After visit meds:  Allergies as of 05/10/2020      Reactions   Penicillins Other (See Comments)   Has patient had a PCN reaction causing immediate rash, facial/tongue/throat swelling, SOB or lightheadedness with hypotension: No Has patient had a PCN reaction causing severe rash involving mucus membranes or skin necrosis: No Has patient had a PCN reaction that required hospitalization: Yes Has patient had a PCN reaction occurring within the last 10 years: No If all of the above answers  are "NO", then may proceed with Cephalosporin use.      Medication List    TAKE these medications   ibuprofen 600 MG tablet Commonly known as: ADVIL Take 1 tablet (600 mg total) by mouth every 6 (six) hours.   pantoprazole 20 MG tablet Commonly known as: Protonix Take 1 tablet (20 mg total) by mouth daily.   Prenatal Plus Iron 29-1 MG Tabs Take 1 daily   Proventil HFA 108 (90 Base) MCG/ACT inhaler Generic drug: albuterol Inhale 2 puffs into the lungs every 4 (four) hours as needed for wheezing or shortness of breath.        Discharge home in stable condition Infant Feeding: Breast Infant Disposition:home with mother Discharge  instruction: per After Visit Summary and Postpartum booklet. Activity: Advance as tolerated. Pelvic rest for 6 weeks.  Diet: routine diet Future Appointments: Future Appointments  Date Time Provider [place]  06/12/2020  9:10 AM Roma Schanz, CNM [facility]   Follow up Visit: Message sent to FT 05/09/20 by Sylvester Harder.   Please schedule this patient for a In person postpartum visit in 6 weeks with the following provider: Any provider. Additional Postpartum F/U:BP check 1 week  High risk pregnancy complicated by: HTN Delivery mode:  Vaginal, Spontaneous  Anticipated Birth Control:  PP Nexplanon placed     05/10/2020 Brooks Sailors, MD

## 2020-05-09 NOTE — Progress Notes (Signed)
Patient ID: Erica Doyle, female   DOB: 04/23/1995, 25 y.o.   MRN: [id number] RN reports pt had a SROM at 0750 Clear fluid

## 2020-05-10 LAB — GC/CHLAMYDIA PROBE AMP (~~LOC~~) NOT AT ARMC
Chlamydia: NEGATIVE
Comment: NEGATIVE
Comment: NORMAL
Neisseria Gonorrhea: NEGATIVE

## 2020-05-10 LAB — GC/CHLAMYDIA PROBE AMP
Chlamydia trachomatis, NAA: NEGATIVE
Neisseria Gonorrhoeae by PCR: NEGATIVE

## 2020-05-10 MED ORDER — IBUPROFEN 600 MG PO TABS: 600.0000 mg | ORAL_TABLET | Freq: Four times a day (QID) | ORAL | 0 refills | Status: DC

## 2020-05-10 MED ORDER — LIDOCAINE HCL 1 % IJ SOLN
0.0000 mL | Freq: Once | INTRAMUSCULAR | Status: AC | PRN
  Administered 2020-05-11: 5 mL via INTRADERMAL
  Filled 2020-05-10: qty 20

## 2020-05-10 MED ORDER — ETONOGESTREL 68 MG ~~LOC~~ IMPL
68.0000 mg | DRUG_IMPLANT | Freq: Once | SUBCUTANEOUS | Status: AC
  Administered 2020-05-11: 68 mg via SUBCUTANEOUS
  Filled 2020-05-10: qty 1

## 2020-05-10 NOTE — Progress Notes (Signed)
Post Partum Day 1  Subjective: no complaints, voiding and + flatus  Objective: Blood pressure 127/65, pulse 78, temperature 98 F (36.7 C), resp. rate 17, height 5\' 1"  (1.549 m), weight 98.2 kg, last menstrual period 08/08/2019, SpO2 97 %, unknown if currently breastfeeding.  Physical Exam:  General: alert, cooperative and appears stated age Lochia: appropriate Uterine Fundus: firm Incision: n/a DVT Evaluation: No evidence of DVT seen on physical exam.  Recent Labs    05/08/20 1539  HGB 10.3*  HCT 31.3*    Assessment/Plan: Plan for discharge tomorrow  MOC: Nexplanin today   LOS: 2 days   05/10/20 [place] 05/10/2020, 8:18 AM

## 2020-05-10 NOTE — Social Work (Signed)
CSW received consult for hx of marijuana use.  Referral was screened out due to the following:  ~MOB had no documented substance use after initial prenatal visit/+UPT. ~MOB had no positive drug screens after initial prenatal visit/+UPT.  Please consult CSW if current concerns arise or by MOB's request.  CSW will monitor UDS/CDS results to follow-up and make report to Child Protective Services if warranted.  Manfred Arch, MSW, [employer] Clinical Social Work [employer] and [employer] [phone]

## 2020-05-10 NOTE — Lactation Note (Signed)
This note was copied from a baby's chart. Lactation Consultation Note  Patient Name: Erica Doyle July Today's Date: 05/10/2020 Reason for consult: Initial assessment;Term;1st time breastfeeding Age:24 hours Per mom, infant had one void diaper. Mom feels breastfeeding is going well, infant is breastfeeding 10 minutes most feedings. LC discussed hand expression and infant was given 2 mls of colostrum by spoon, afterwards infant was cuing to breastfeed. Mom latched infant on her left breast using the football hold position, infant latched with depth and was still breastfeeding after 15 minutes when LC left the room.  Mom knows to try and latch infant on both breast during feedings. Mom knows to breastfeed infant according to cues, 8 to 12 or more times within 24 hours, STS. Mom knows  to ask RN or LC for assistance with latch if needed. LC discussed infant's input and output with parents. Mom was given hand pump for prn use and explained how to use, clean and assemble breast pump and breast milk collection and storage. Mom made aware of O/P services, breastfeeding support groups, community resources, and our phone # for post-discharge questions.   Maternal Data Has patient been taught Hand Expression?: Yes Does the patient have breastfeeding experience prior to this delivery?: No  Feeding Mother's Current Feeding Choice: Breast Milk  LATCH Score Latch: Grasps breast easily, tongue down, lips flanged, rhythmical sucking.  Audible Swallowing: Spontaneous and intermittent  Type of Nipple: Everted at rest and after stimulation  Comfort (Breast/Nipple): Soft / non-tender  Hold (Positioning): Assistance needed to correctly position infant at breast and maintain latch.  LATCH Score: 9   Lactation Tools Discussed/Used Tools: Pump Breast pump type: Manual Pump Education: Setup, frequency, and cleaning;Milk Storage Reason for Pumping: prn, mom doesn't have breastpump at home. Pumping  frequency: prn  Interventions Interventions: Breast feeding basics reviewed;Assisted with latch;Skin to skin;Breast massage;Hand express;Breast compression;Adjust position;Support pillows;Position options;Expressed milk;Hand pump;Education  Discharge Pump: Manual WIC Program: Yes  Consult Status Consult Status: Follow-up Date: 05/10/20 Follow-up type: In-patient    Danelle Earthly 05/10/2020, 12:10 AM

## 2020-05-10 NOTE — Lactation Note (Signed)
This note was copied from a baby's chart. Lactation Consultation Note  Patient Name: Erica Doyle Today's Date: 05/10/2020 Reason for consult: Follow-up assessment Age:24 hours  P1 mother whose infant is now 65 hours old.  This is a term baby at 39+2 weeks.  Baby was asleep on mother's chest when I arrived.  Mother informed me that she recently finished a 45 minute feeding.  Mother felt like baby latched well.  Encouraged to continue feeding on cue or at least 8-12 times/24 hours.  Offered to return at the next feeding for assistance if desired.  Mother is familiar with hand expression and will continue this before/after feedings.  She can finger feed or spoon feed any EBM she obtains.  Family in room.   Maternal Data    Feeding    LATCH Score Latch: Repeated attempts needed to sustain latch, nipple held in mouth throughout feeding, stimulation needed to elicit sucking reflex.  Audible Swallowing: None  Type of Nipple: Everted at rest and after stimulation  Comfort (Breast/Nipple): Soft / non-tender  Hold (Positioning): Assistance needed to correctly position infant at breast and maintain latch.  LATCH Score: 6   Lactation Tools Discussed/Used    Interventions    Discharge    Consult Status Consult Status: Follow-up Date: 05/11/20 Follow-up type: In-patient    Beth R DelFava 05/10/2020, 11:08 AM

## 2020-05-11 DIAGNOSIS — Z30017 Encounter for initial prescription of implantable subdermal contraceptive: Secondary | ICD-10-CM

## 2020-05-11 MED ORDER — AMLODIPINE BESYLATE 5 MG PO TABS
5.0000 mg | ORAL_TABLET | Freq: Every day | ORAL | Status: DC
  Administered 2020-05-11: 5 mg via ORAL
  Filled 2020-05-11: qty 1

## 2020-05-11 MED ORDER — ETONOGESTREL 68 MG ~~LOC~~ IMPL: 68.0000 mg | DRUG_IMPLANT | Freq: Once | SUBCUTANEOUS | Status: DC

## 2020-05-11 MED ORDER — AMLODIPINE BESYLATE 5 MG PO TABS: 5.0000 mg | ORAL_TABLET | Freq: Every day | ORAL | 2 refills | Status: DC

## 2020-05-11 MED ORDER — LIDOCAINE HCL 1 % IJ SOLN: 0.0000 mL | Freq: Once | INTRAMUSCULAR | Status: DC | PRN

## 2020-05-11 NOTE — Lactation Note (Signed)
This note was copied from a baby's chart. Lactation Consultation Note  Patient Name: Girl Shefali Ng Today's Date: 05/11/2020 Reason for consult: Follow-up assessment Age:24 hours  P1 mother whose infant is now 70 hours old.  This is a term baby at 39+2 weeks.  Baby was asleep in mother's arms when I arrived.  Mother had no questions/concerns related to breastfeeding and feels like baby is latching well.  Last LATCH score 8.  Baby has multiple voids/stools.  Mother has our OP phone number for questions/concerns after discharge.  Support person present and asleep on the couch.   Maternal Data    Feeding    LATCH Score                    Lactation Tools Discussed/Used    Interventions Interventions: Education  Discharge Discharge Education: Engorgement and breast care  Consult Status Consult Status: Complete Date: 05/11/20 Follow-up type: Call as needed    Beth R DelFava 05/11/2020, 8:32 AM

## 2020-05-11 NOTE — Discharge Summary (Signed)
Postpartum Discharge Summary  Date of Service updated 05/11/20   Patient Name: Erica Doyle DOB: [birth year] MRN: [id number]  Date of admission: 05/08/2020 Delivery date:05/09/2020  Delivering provider: Julianne Handler  Date of discharge: 05/11/2020  Admitting diagnosis: Gestational hypertension [O13.9] Intrauterine pregnancy: [redacted]w[redacted]d    Secondary diagnosis:  Active Problems:   Marijuana use   Chlamydia infection affecting pregnancy   Gestational hypertension   Vaginal delivery   Second degree perineal laceration  Additional problems: none    Discharge diagnosis: Term Pregnancy Delivered and Gestational Hypertension                                              Post partum procedures:Nexplanon Augmentation: AROM, Pitocin, Cytotec and IP Foley Complications: None  Hospital course: Induction of Labor With Vaginal Delivery   25y.o. yo G1P0000 at 342w2das admitted to the hospital 05/08/2020 for induction of labor.  Indication for induction: Gestational hypertension.  Patient had an uncomplicated labor course as follows: Membrane Rupture Time/Date: 7:50 AM ,05/09/2020   Delivery Method:Vaginal, Spontaneous  Episiotomy: None  Lacerations:  2nd degree;Perineal;Periurethral  Details of delivery can be found in separate delivery note.  Patient had a routine postpartum course. Patient is discharged home 05/11/20.  Newborn Data: Birth date:05/09/2020  Birth time:5:47 PM  Gender:Female  Living status:Living  Apgars:3 ,7  Weight:3300 g   Magnesium Sulfate received: No BMZ received: No Rhophylac:N/A MMR:N/A T-DaP:Given prenatally Flu: Yes Transfusion:No  Physical exam  Vitals:   05/10/20 0950 05/10/20 1454 05/10/20 2148 05/11/20 0500  BP: 119/80 133/86 135/83 119/81  Pulse: 71 98 86 65  Resp: '17 16 16 16  ' Temp: 99 F (37.2 C) 98.6 F (37 C) 98.9 F (37.2 C) 98.1 F (36.7 C)  TempSrc: Oral Oral Oral Oral  SpO2:  98%    Weight:      Height:       General:  alert Lochia: appropriate Uterine Fundus: firm Incision: N/A DVT Evaluation: No evidence of DVT seen on physical exam. Labs: Lab Results  Component Value Date   WBC 12.1 (H) 05/08/2020   HGB 10.3 (L) 05/08/2020   HCT 31.3 (L) 05/08/2020   MCV 83.5 05/08/2020   PLT 274 05/08/2020   CMP Latest Ref Rng & Units 05/08/2020  Glucose 70 - 99 mg/dL 109(H)  BUN 6 - 20 mg/dL <5(L)  Creatinine 0.44 - 1.00 mg/dL 0.55  Sodium 135 - 145 mmol/L 137  Potassium 3.5 - 5.1 mmol/L 3.5  Chloride 98 - 111 mmol/L 107  CO2 22 - 32 mmol/L 21(L)  Calcium 8.9 - 10.3 mg/dL 9.1  Total Protein 6.5 - 8.1 g/dL 5.9(L)  Total Bilirubin 0.3 - 1.2 mg/dL 1.0  Alkaline Phos 38 - 126 U/L 159(H)  AST 15 - 41 U/L 20  ALT 0 - 44 U/L 14   Edinburgh Score: No flowsheet data found.   After visit meds:  Allergies as of 05/11/2020      Reactions   Penicillins Anaphylaxis   Has patient had a PCN reaction causing immediate rash, facial/tongue/throat swelling, SOB or lightheadedness with hypotension: No Has patient had a PCN reaction causing severe rash involving mucus membranes or skin necrosis: No Has patient had a PCN reaction that required hospitalization: Yes Has patient had a PCN reaction occurring within the last 10 years: No If all of the above answers are "NO",  then may proceed with Cephalosporin use.      Medication List    TAKE these medications   amLODipine 5 MG tablet Commonly known as: NORVASC Take 1 tablet (5 mg total) by mouth daily.   ibuprofen 600 MG tablet Commonly known as: ADVIL Take 1 tablet (600 mg total) by mouth every 6 (six) hours.   pantoprazole 20 MG tablet Commonly known as: Protonix Take 1 tablet (20 mg total) by mouth daily.   Prenatal Plus Iron 29-1 MG Tabs Take 1 daily What changed:   how much to take  how to take this  when to take this   Proventil HFA 108 (90 Base) MCG/ACT inhaler Generic drug: albuterol Inhale 2 puffs into the lungs every 4 (four) hours as  needed for wheezing or shortness of breath.        Discharge home in stable condition Infant Feeding: Breast Infant Disposition:home with mother Discharge instruction: per After Visit Summary and Postpartum booklet. Activity: Advance as tolerated. Pelvic rest for 6 weeks.  Diet: routine diet Future Appointments: Future Appointments  Date Time Provider [place]  05/16/2020 10:10 AM [facility] NURSE [facility]  06/12/2020  9:10 AM Roma Schanz, CNM [facility]   Follow up Visit:  Follow-up Information    [facility] Follow up on 05/16/2020.   Specialty: Obstetrics and Gynecology Why: for blood pressure check Contact information: [place] [phone]             Message sent to FT 05/09/20 by Sylvester Harder.   Please schedule this patient for a In person postpartum visit in 6 weeks with the following provider: Any provider. Additional Postpartum F/U:BP check 1 week  High risk pregnancy complicated by: HTN Delivery mode:  Vaginal, Spontaneous  Anticipated Birth Control:  PP Nexplanon placed     05/11/2020 Christin Fudge, CNM

## 2020-05-11 NOTE — Procedures (Signed)
PRE-OP DIAGNOSIS: desired long-term, reversible contraception   POST-OP DIAGNOSIS: Same   PROCEDURE: Nexplanon  placement  Performing Physician: Shonna Chock, MD   PROCEDURE:  Written informed consent obtained Site (check): left arm        Sterile Preparation:    [x]       Betadine        [_]     Chloraprep             After a time-out, insertion site was selected 6 - 10 cm from medial epicondyle and marked. Procedure area was prepped and draped in a sterile fashion. 5 mL of 1% lidocaine w/o epinephrine was used for subcutaneous anesthesia. Anesthesia confirmed.  Nexplanon  trocar was inserted subcutaneously and then Nexplanon  capsule delivered subcutaneously. Trocar was removed from the insertion site. Nexplanon  capsule was palpated by provider and patient to assure satisfactory placement.  Estimated blood loss: minimal Dressings applied: steri-strip and coband Followup: The patient tolerated the procedure well without complications.  Standard post-procedure care wass explained and return precautions were given.

## 2020-05-16 ENCOUNTER — Other Ambulatory Visit: Payer: Self-pay | Admitting: Obstetrics & Gynecology

## 2020-05-16 ENCOUNTER — Ambulatory Visit: Payer: Medicaid Other

## 2020-05-16 ENCOUNTER — Other Ambulatory Visit: Payer: Self-pay

## 2020-05-16 VITALS — BP 137/94 | HR 94

## 2020-05-16 DIAGNOSIS — O133 Gestational [pregnancy-induced] hypertension without significant proteinuria, third trimester: Secondary | ICD-10-CM

## 2020-05-16 DIAGNOSIS — O165 Unspecified maternal hypertension, complicating the puerperium: Secondary | ICD-10-CM

## 2020-05-16 MED ORDER — AMLODIPINE BESYLATE 10 MG PO TABS: 10.0000 mg | ORAL_TABLET | Freq: Every day | ORAL | 3 refills | Status: DC

## 2020-05-16 NOTE — Progress Notes (Signed)
RN visit- BP reviewed, plan to increase norvasc, f/u in 1wk

## 2020-05-16 NOTE — Progress Notes (Signed)
   NURSE VISIT- BLOOD PRESSURE CHECK  SUBJECTIVE:  Erica Doyle is a 25 y.o. G5P1001 female here for BP check. She is postpartum, delivery date 05/09/20    HYPERTENSION ROS:  Pregnant/postpartum:  . Severe headaches that don't go away with tylenol/other medicines: No  . Visual changes (seeing spots/double/blurred vision) No  . Severe pain under right breast breast or in center of upper chest No  . Severe nausea/vomiting No  . Taking medicines as instructed yes  OBJECTIVE:  BP (!) 137/94 (BP Location: Right Arm, Patient Position: Sitting, Cuff Size: Normal)   Pulse 94   Breastfeeding Yes   Appearance alert, well appearing, and in no distress and oriented to person, place, and time.  ASSESSMENT: Postpartum  blood pressure check  PLAN: Discussed with Dr. [name]   Recommendations: new prescription will be sent   Follow-up: in 1 week   Fatiha Guzy A Gianni Fuchs  05/16/2020 10:29 AM

## 2020-06-12 ENCOUNTER — Other Ambulatory Visit: Payer: Self-pay

## 2020-06-12 ENCOUNTER — Encounter: Payer: Self-pay | Admitting: Women's Health

## 2020-06-12 ENCOUNTER — Ambulatory Visit (INDEPENDENT_AMBULATORY_CARE_PROVIDER_SITE_OTHER): Payer: Medicaid Other | Admitting: Women's Health

## 2020-06-12 DIAGNOSIS — Z8759 Personal history of other complications of pregnancy, childbirth and the puerperium: Secondary | ICD-10-CM

## 2020-06-12 NOTE — Patient Instructions (Addendum)
Stop blood pressure medicine  Tips To Increase Milk Supply  Lots of water! Enough so that your urine is clear  Plenty of calories, if you're not getting enough calories, your milk supply can decrease  Breastfeed/pump often, every 2-3 hours x 20-16mins  Fenugreek 3 pills 3 times a day, this may make your urine smell like maple syrup  Mother's Milk Tea  Lactation cookies, google for the recipe  Real oatmeal  Body Armor sports drinks  Greater Than hydration drink

## 2020-06-12 NOTE — Progress Notes (Signed)
POSTPARTUM VISIT Patient name: Erica Doyle MRN [id number]  Date of birth: 1995/02/28 Chief Complaint:   Postpartum Care  History of Present Illness:   Erica Doyle is a 25 y.o. G47P1001 African American female being seen today for a postpartum visit. She is 4 weeks postpartum following a spontaneous vaginal delivery at 39.2 gestational weeks. IOL: yes, for gestational hypertension  and n/a. Anesthesia: local.  Laceration: 2nd degree.  Complications: none. Inpatient contraception: yes Nexplanon inserted 05/11/20.   Pregnancy complicated by GHTN. Tobacco use: former . Substance use disorder: no. Last pap smear: 04/06/19 and results were NILM w/ HRHPV negative. Next pap smear due: 2024 No LMP recorded. (Menstrual status: Lactating).  Postpartum course has been complicated by PPHTN, d/c'd on norvasc 87m, increased to 11mat 1wk bp check. Bleeding flow about like a period. Bowel function is normal. Bladder function is normal. Urinary incontinence? no, fecal incontinence? no Patient is not sexually active. Last sexual activity: prior to birth of baby. Desired contraception: PP Nexplanon placed. Patient does want a pregnancy in the future.  Desired family size is 2 children.   Upstream - 06/12/20 0917      Pregnancy Intention Screening   Does the patient want to become pregnant in the next year? No    Does the patient's partner want to become pregnant in the next year? No    Would the patient like to discuss contraceptive options today? No      Contraception Wrap Up   Current Method Hormonal Implant    End Method Hormonal Implant    Contraception Counseling Provided No          The pregnancy intention screening data noted above was reviewed. Potential methods of contraception were discussed. The patient elected to proceed with Hormonal Implant.   Edinburgh Postpartum Depression Screening: negative  Edinburgh Postnatal Depression Scale - 06/12/20 0918      Edinburgh Postnatal  Depression Scale:  In the Past 7 Days   I have been able to laugh and see the funny side of things. 0    I have looked forward with enjoyment to things. 0    I have blamed myself unnecessarily when things went wrong. 0    I have been anxious or worried for no good reason. 0    I have felt scared or panicky for no good reason. 0    Things have been getting on top of me. 0    I have been so unhappy that I have had difficulty sleeping. 0    I have felt sad or miserable. 0    I have been so unhappy that I have been crying. 0    The thought of harming myself has occurred to me. 0    Edinburgh Postnatal Depression Scale Total 0          Baby's course has been uncomplicated. Baby is feeding by breast: milk supply inadequate . Infant has a pediatrician/family doctor? Yes.  Childcare strategy if returning to work/school: family.  Pt has material needs met for her and baby: Yes.   Review of Systems:   Pertinent items are noted in HPI Denies Abnormal vaginal discharge w/ itching/odor/irritation, headaches, visual changes, shortness of breath, chest pain, abdominal pain, severe nausea/vomiting, or problems with urination or bowel movements. Pertinent History Reviewed:  Reviewed past medical,surgical, obstetrical and family history.  Reviewed problem list, medications and allergies. OB History  Gravida Para Term Preterm AB Living  '1 1 1 ' 0 0  1  SAB IAB Ectopic Multiple Live Births  0 0 0 0 1    # Outcome Date GA Lbr Len/2nd Weight Sex Delivery Anes PTL Lv  1 Term 05/09/20 [id number]:44 / 02:47 7 lb 4.4 oz (3.3 kg) F Vag-Spont Local  LIV     Birth Comments: WDL   Physical Assessment:   Vitals:   06/12/20 0916  BP: 124/79  Pulse: 93  Weight: 180 lb (81.6 kg)  Height: '5\' 1"'  (1.549 m)  Body mass index is 34.01 kg/m.       Physical Examination:   General appearance: alert, well appearing, and in no distress  Mental status: alert, oriented to person, place, and time  Skin: warm & dry    Cardiovascular: normal heart rate noted   Respiratory: normal respiratory effort, no distress   Breasts: deferred, no complaints   Abdomen: soft, non-tender   Pelvic: VULVA: normal appearing vulva with no masses, tenderness or lesions. Thin prep pap obtained: No  Rectal: not examined  Extremities: Edema: none   Chaperone: ACelene Squibb        No results found for this or any previous visit (from the past 24 hour(s)).  Assessment & Plan:  1) Postpartum exam 2) 4 wks s/p spontaneous vaginal delivery after IOL for GHTN 3) breast & bottle feeding 4) Depression screening 5) Contraception: s/p Nexplanon 05/11/20 6) PPHTN> stop norvasc 170m f/u 1wk for bp check w/ nurse  Essential components of care per ACOG recommendations:  1.  Mood and well being:  . If positive depression screen, discussed and plan developed.  . If using tobacco we discussed reduction/cessation and risk of relapse . If current substance abuse, we discussed and referral to local resources was offered.   2. Infant care and feeding:  . If breastfeeding, discussed returning to work, pumping, breastfeeding-associated pain, guidance regarding return to fertility while lactating if not using another method. If needed, patient was provided with a letter to be allowed to pump q 2-3hrs to support lactation in a private location with access to a refrigerator to store breastmilk.   . Recommended that all caregivers be immunized for flu, pertussis and other preventable communicable diseases . If pt does not have material needs met for her/baby, referred to local resources for help obtaining these.  3. Sexuality, contraception and birth spacing . Provided guidance regarding sexuality, management of dyspareunia, and resumption of intercourse . Discussed avoiding interpregnancy interval <35m80m and recommended birth spacing of 18 months  4. Sleep and fatigue . Discussed coping options for fatigue and sleep  disruption . Encouraged family/partner/community support of 4 hrs of uninterrupted sleep to help with mood and fatigue  5. Physical recovery  . If pt had a C/S, assessed incisional pain and providing guidance on normal vs prolonged recovery . If pt had a laceration, perineal healing and pain reviewed.  . If urinary or fecal incontinence, discussed management and referred to PT or uro/gyn if indicated  . Patient is safe to resume physical activity. Discussed attainment of healthy weight.  6.  Chronic disease management . Discussed pregnancy complications if any, and their implications for future childbearing and long-term maternal health. . Review recommendations for prevention of recurrent pregnancy complications, such as 17 hydroxyprogesterone caproate to reduce risk for recurrent PTB not applicable, or aspirin to reduce risk of preeclampsia yes. . Pt had GDM: no. If yes, 2hr GTT scheduled: not applicable. . Reviewed medications and non-pregnant dosing including consideration of whether pt is breastfeeding  using a reliable resource such as LactMed: yes . Referred for f/u w/ PCP or subspecialist providers as indicated: not applicable  7. Health maintenance . Mammogram at 25yo or earlier if indicated . Pap smears as indicated  Meds: No orders of the defined types were placed in this encounter.   Follow-up: Return in about 1 week (around 06/19/2020) for nurse visit bp check.   No orders of the defined types were placed in this encounter.   [place], [facility] 06/12/2020 9:44 AM

## 2020-06-19 ENCOUNTER — Encounter: Payer: Self-pay | Admitting: *Deleted

## 2020-06-19 ENCOUNTER — Ambulatory Visit (INDEPENDENT_AMBULATORY_CARE_PROVIDER_SITE_OTHER): Payer: Medicaid Other | Admitting: *Deleted

## 2020-06-19 ENCOUNTER — Other Ambulatory Visit: Payer: Self-pay

## 2020-06-19 ENCOUNTER — Encounter: Payer: Self-pay | Admitting: Women's Health

## 2020-06-19 VITALS — BP 123/87 | HR 81 | Ht 61.0 in | Wt 179.0 lb

## 2020-06-19 DIAGNOSIS — Z0130 Encounter for examination of blood pressure without abnormal findings: Secondary | ICD-10-CM

## 2020-06-19 NOTE — Progress Notes (Addendum)
   NURSE VISIT- BLOOD PRESSURE CHECK  SUBJECTIVE:  Erica Doyle is a 25 y.o. G79P1001 female here for BP check. She is postpartum, delivery date 05/09/20    HYPERTENSION ROS:  Pregnant/postpartum:  . Severe headaches that don't go away with tylenol/other medicines: No  . Visual changes (seeing spots/double/blurred vision) No  . Severe pain under right breast breast or in center of upper chest No  . Severe nausea/vomiting No  . Taking medicines as instructed not applicable  .   OBJECTIVE:  BP 123/87 (BP Location: Left Arm, Patient Position: Sitting, Cuff Size: Normal)   Pulse 81   Ht 5\' 1"  (1.549 m)   Wt 179 lb (81.2 kg)   Breastfeeding Yes   BMI 33.82 kg/m   Appearance alert, well appearing, and in no distress.  ASSESSMENT: Postpartum  blood pressure check  PLAN: Discussed with , CNM, [facility]   Recommendations: no changes needed   Follow-up: yearly   [facility]  06/19/2020 3:08 PM   Chart reviewed for nurse visit. Agree with plan of care.  06/21/2020, Cheral Marker 06/20/2020 4:50 PM

## 2020-10-23 ENCOUNTER — Other Ambulatory Visit: Payer: Medicaid Other

## 2020-10-24 ENCOUNTER — Other Ambulatory Visit: Payer: Self-pay

## 2020-10-24 ENCOUNTER — Other Ambulatory Visit (INDEPENDENT_AMBULATORY_CARE_PROVIDER_SITE_OTHER): Payer: Medicaid Other

## 2020-10-24 DIAGNOSIS — R399 Unspecified symptoms and signs involving the genitourinary system: Secondary | ICD-10-CM | POA: Diagnosis not present

## 2020-10-24 LAB — POCT URINALYSIS DIPSTICK OB
Glucose, UA: NEGATIVE
Ketones, UA: NEGATIVE
Nitrite, UA: NEGATIVE
POC,PROTEIN,UA: NEGATIVE

## 2020-10-24 NOTE — Progress Notes (Signed)
Chart reviewed for nurse visit. Agree with plan of care.  Adline Potter, NP 10/24/2020 5:03 PM

## 2020-10-24 NOTE — Progress Notes (Signed)
   NURSE VISIT- UTI SYMPTOMS   SUBJECTIVE:  Erica Doyle is a 25 y.o. G85P1001 female here for UTI symptoms. She is postpartum. She reports cloudy urine, lower abdominal pain, and odor .  OBJECTIVE:  There were no vitals taken for this visit.  Appears well, in no apparent distress  Results for orders placed or performed in visit on 10/24/20 (from the past 24 hour(s))  POC Urinalysis Dipstick OB   Collection Time: 10/24/20  4:10 PM  Result Value Ref Range   Color, UA     Clarity, UA     Glucose, UA Negative Negative   Bilirubin, UA     Ketones, UA neg    Spec Grav, UA     Blood, UA small    pH, UA     POC,PROTEIN,UA Negative Negative, Trace, Small (1+), Moderate (2+), Large (3+), 4+   Urobilinogen, UA     Nitrite, UA neg    Leukocytes, UA Moderate (2+) (A) Negative   Appearance     Odor      ASSESSMENT: Postpartum with UTI symptoms and negative nitrites  PLAN: Note routed to Erica Doyle, AGNP   Rx sent by provider today: No Urine culture sent Call or return to clinic prn if these symptoms worsen or fail to improve as anticipated. Follow-up: as needed   Erica Doyle A Erica Doyle  10/24/2020 4:11 PM

## 2020-10-25 LAB — MICROSCOPIC EXAMINATION
Casts: NONE SEEN /lpf
Epithelial Cells (non renal): NONE SEEN /hpf (ref 0–10)
WBC, UA: >30 /hpf — AB (ref 0–5)

## 2020-10-25 LAB — URINALYSIS, ROUTINE W REFLEX MICROSCOPIC
Bilirubin, UA: NEGATIVE
Glucose, UA: NEGATIVE
Ketones, UA: NEGATIVE
Nitrite, UA: NEGATIVE
Specific Gravity, UA: 1.018 (ref 1.005–1.030)
Urobilinogen, Ur: 0.2 mg/dL (ref 0.2–1.0)
pH, UA: 8.5 — ABNORMAL HIGH (ref 5.0–7.5)

## 2020-10-27 LAB — URINE CULTURE

## 2020-10-30 ENCOUNTER — Telehealth: Payer: Self-pay | Admitting: Adult Health

## 2020-10-30 MED ORDER — SULFAMETHOXAZOLE-TRIMETHOPRIM 800-160 MG PO TABS: 1.0000 | ORAL_TABLET | Freq: Two times a day (BID) | ORAL | 0 refills | Status: DC

## 2020-10-30 NOTE — Telephone Encounter (Signed)
Pt aware of urine, she is not breastfeeding, will rx septra ds

## 2020-10-30 NOTE — Telephone Encounter (Signed)
Left message to call me, urine +proteus, and need to rx antibiotic, are you breastfeeding??

## 2021-01-15 ENCOUNTER — Ambulatory Visit
Admission: EM | Admit: 2021-01-15 | Discharge: 2021-01-15 | Disposition: A | Discharge status: Home / Self Care | Payer: Medicaid Other | Attending: Student | Admitting: Student

## 2021-01-15 ENCOUNTER — Other Ambulatory Visit: Payer: Self-pay

## 2021-01-15 DIAGNOSIS — L509 Urticaria, unspecified: Secondary | ICD-10-CM

## 2021-01-15 MED ORDER — TRIAMCINOLONE ACETONIDE 0.5 % EX OINT: 1.0000 "application " | TOPICAL_OINTMENT | Freq: Two times a day (BID) | CUTANEOUS | 0 refills | Status: AC

## 2021-01-15 NOTE — ED Triage Notes (Signed)
Patient presents to Urgent Care with complaints of hives on bilateral hands since yesterday. Pt states from handling her artificial christmas tree. Treated with benadryl with no improvement.   Denies SOB, facial or throat swelling.

## 2021-01-15 NOTE — Discharge Instructions (Addendum)
-  Triamcinolone ointment twice daily while symptoms persist -Benedryl (diphenhydramine) 25-50mg  (1-2 pills) as needed for itching, up to every 6 hours.  This medication will cause drowsiness.

## 2021-01-15 NOTE — ED Provider Notes (Signed)
[facility]    CSN: [id number] Arrival date & time: 01/15/21  0847      History   Chief Complaint Chief Complaint  Patient presents with   Rash    HPI Erica Doyle is a 25 y.o. female presenting with rash bilateral hands x1 day since putting up her artificial christmas tree. Medical history noncontributory, denies allergies other than penicillin.  States that the rash was initially on the dorsum of her left hand, it has since spread to bilateral hands, palms as well as fingers.  It is itchy and painful.  Has tried Benadryl p.o. with minimal improvement.  Denies rashes elsewhere, denies facial swelling, sensation of throat closing, dizziness.  HPI  Past Medical History:  Diagnosis Date   Medical history non-contributory    Nexplanon insertion 12/31/2012   nexplanon inserted left arm 12/31/12 remove 01/01/16   Trichimoniasis 04/07/2019   Treated 04/07/19,  Poc____________    Patient Active Problem List   Diagnosis Date Noted   History of gestational hypertension & PPHTN 05/08/2020   Chlamydia infection affecting pregnancy 04/20/2020   Trichimoniasis 04/07/2019    Past Surgical History:  Procedure Laterality Date   KNEE SURGERY Left     OB History     Gravida  1   Para  1   Term  1   Preterm  0   AB  0   Living  1      SAB  0   IAB  0   Ectopic  0   Multiple  0   Live Births  1            Home Medications    Prior to Admission medications   Medication Sig Start Date End Date Taking? Authorizing Provider  triamcinolone ointment (KENALOG) 0.5 % Apply 1 application topically 2 (two) times daily for 7 days. 01/15/21 01/22/21 Yes Rhys Martini, PA-C  etonogestrel (NEXPLANON) 68 MG IMPL implant 1 each by Subdermal route once.    [provider]  Prenatal Vit-Iron Carbonyl-FA (PRENATAL PLUS IRON) 29-1 MG TABS Take 1 daily Patient taking differently: Take 1 tablet by mouth daily. Take 1 daily 09/12/19   Adline Potter, NP   PROVENTIL HFA 108 (90 BASE) MCG/ACT inhaler Inhale 2 puffs into the lungs every 4 (four) hours as needed for wheezing or shortness of breath.  07/11/14   [provider]  sulfamethoxazole-trimethoprim (BACTRIM DS) 800-160 MG tablet Take 1 tablet by mouth 2 (two) times daily. Take 1 bid 10/30/20   Adline Potter, NP    Family History Family History  Problem Relation Age of Onset   Hypertension Maternal Grandmother    Hypertension Paternal Grandmother     Social History Social History   Tobacco Use   Smoking status: Former    Types: Cigars    Quit date: 2021    Years since quitting: 1.8   Smokeless tobacco: Never  Vaping Use   Vaping Use: Never used  Substance Use Topics   Alcohol use: No   Drug use: No     Allergies   Penicillins   Review of Systems Review of Systems  Skin:  Positive for rash.  All other systems reviewed and are negative.   Physical Exam Triage Vital Signs ED Triage Vitals  Enc Vitals Group     BP 01/15/21 1104 (!) 128/91     Pulse Rate 01/15/21 1104 80     Resp 01/15/21 1104 16  Temp 01/15/21 1104 97.7 F (36.5 C)     Temp Source 01/15/21 1104 Oral     SpO2 01/15/21 1104 95 %     Weight --      Height --      Head Circumference --      Peak Flow --      Pain Score 01/15/21 1101 0     Pain Loc --      Pain Edu? --      Excl. in GC? --    No data found.  Updated Vital Signs BP (!) 128/91 (BP Location: Right Arm)   Pulse 80   Temp 97.7 F (36.5 C) (Oral)   Resp 16   SpO2 95%   Breastfeeding No   Visual Acuity Right Eye Distance:   Left Eye Distance:   Bilateral Distance:    Right Eye Near:   Left Eye Near:    Bilateral Near:     Physical Exam Vitals reviewed.  Constitutional:      General: She is not in acute distress.    Appearance: Normal appearance. She is not ill-appearing.  HENT:     Head: Normocephalic and atraumatic.  Pulmonary:     Effort: Pulmonary effort is normal.  Skin:    Comments:  Bilateral hands: hives to dorsal and palmar aspects, as well as fingers and interdigital webs. L>R. No burrows or bite marks. No swelling. No facial or pharyngeal lip/tongue/uvula swelling; airway is patent.  Neurological:     General: No focal deficit present.     Mental Status: She is alert and oriented to person, place, and time.  Psychiatric:        Mood and Affect: Mood normal.        Behavior: Behavior normal.        Thought Content: Thought content normal.        Judgment: Judgment normal.     UC Treatments / Results  Labs (all labs ordered are listed, but only abnormal results are displayed) Labs Reviewed - No data to display  EKG   Radiology No results found.  Procedures Procedures (including critical care time)  Medications Ordered in UC Medications - No data to display  Initial Impression / Assessment and Plan / UC Course  I have reviewed the triage vital signs and the nursing notes.  Pertinent labs & imaging results that were available during my care of the patient were reviewed by me and considered in my medical decision making (see chart for details).     This patient is a very pleasant 25 y.o. year old female presenting with urticarial rash. Today this pt is afebrile nontachycardic nontachypneic, oxygenating well on room air. Triamcinolone ointment sent, continue benedryl. ED return precautions discussed. Patient verbalizes understanding and agreement.   .   Final Clinical Impressions(s) / UC Diagnoses   Final diagnoses:  Urticarial rash     Discharge Instructions      -Triamcinolone ointment twice daily while symptoms persist -Benedryl (diphenhydramine) 25-50mg  (1-2 pills) as needed for itching, up to every 6 hours.  This medication will cause drowsiness.    ED Prescriptions     Medication Sig Dispense Auth. Provider   triamcinolone ointment (KENALOG) 0.5 % Apply 1 application topically 2 (two) times daily for 7 days. 30 g Rhys Martini,  PA-C      PDMP not reviewed this encounter.   Rhys Martini, PA-C 01/15/21 1136

## 2021-01-30 ENCOUNTER — Encounter: Payer: Self-pay | Admitting: Adult Health

## 2021-01-30 ENCOUNTER — Ambulatory Visit (INDEPENDENT_AMBULATORY_CARE_PROVIDER_SITE_OTHER): Payer: Medicaid Other | Admitting: Adult Health

## 2021-01-30 ENCOUNTER — Other Ambulatory Visit (HOSPITAL_COMMUNITY)
Admission: RE | Admit: 2021-01-30 | Discharge: 2021-01-30 | Disposition: A | Discharge status: Home / Self Care | Payer: Medicaid Other | Source: Ambulatory Visit | Attending: Adult Health | Admitting: Adult Health

## 2021-01-30 ENCOUNTER — Other Ambulatory Visit: Payer: Self-pay

## 2021-01-30 VITALS — BP 136/86 | HR 90 | Ht 61.0 in | Wt 179.0 lb

## 2021-01-30 DIAGNOSIS — B379 Candidiasis, unspecified: Secondary | ICD-10-CM | POA: Diagnosis not present

## 2021-01-30 DIAGNOSIS — N898 Other specified noninflammatory disorders of vagina: Secondary | ICD-10-CM

## 2021-01-30 MED ORDER — FLUCONAZOLE 150 MG PO TABS: ORAL_TABLET | ORAL | 1 refills | Status: DC

## 2021-01-30 NOTE — Progress Notes (Signed)
  Subjective:     Patient ID: Janina Mayo, female   DOB: Apr 06, 1995, 25 y.o.   MRN: [id number]  HPI Scarleth is a 25 year old black female,single, G1P1 in complaining of vaginal discharge and feels swollen,?? Yeast.  Lab Results  Component Value Date   DIAGPAP  04/06/2019    - Negative for intraepithelial lesion or malignancy (NILM)    Review of Systems +vaginal discharge for about a week Feels swollen in peri area, ?yeast    Reviewed past medical,surgical, social and family history. Reviewed medications and allergies.  Objective:   Physical Exam BP 136/86 (BP Location: Left Arm, Patient Position: Sitting, Cuff Size: Normal)   Pulse 90   Ht 5\' 1"  (1.549 m)   Wt 179 lb (81.2 kg)   LMP 12/25/2020   Breastfeeding No   BMI 33.82 kg/m     Skin warm and dry.Pelvic: external genitalia is normal in appearance no lesions, vagina: white clumpy discharge without odor,red side walls, cleaned discharge out with big Q tip,urethra has no lesions or masses noted, cervix:smooth and bulbous, uterus: normal size, shape and contour, non tender, no masses felt, adnexa: no masses or tenderness noted. Bladder is non tender and no masses felt.CV swab obtained.   Upstream - 01/29/21 1431       Contraception Wrap Up   Current Method Hormonal Implant    End Method Hormonal Implant    Contraception Counseling Provided No            Examination chaperoned by 14/06/22 LPN   Assessment:     1. Yeast infection CV swab sent Will rx diflucan Meds ordered this encounter  Medications   fluconazole (DIFLUCAN) 150 MG tablet    Sig: Take 1 now and 1 in 3 days    Dispense:  2 tablet    Refill:  1    Order Specific Question:   Supervising Provider    Answer:   Faith Rogue, LUTHER H [[id number]]     2. Vaginal discharge CV swab sent for GC/CHL,trich,BV and yeast     Plan:     Follow up prn

## 2021-02-01 LAB — CERVICOVAGINAL ANCILLARY ONLY
Bacterial Vaginitis (gardnerella): NEGATIVE
Candida Glabrata: NEGATIVE
Candida Vaginitis: POSITIVE — AB
Chlamydia: NEGATIVE
Comment: NEGATIVE
Comment: NEGATIVE
Comment: NEGATIVE
Comment: NEGATIVE
Comment: NEGATIVE
Comment: NORMAL
Neisseria Gonorrhea: NEGATIVE
Trichomonas: NEGATIVE

## 2021-02-04 ENCOUNTER — Other Ambulatory Visit: Payer: Self-pay | Admitting: Adult Health
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
## 2021-03-06 ENCOUNTER — Other Ambulatory Visit: Payer: Self-pay | Admitting: Adult Health

## 2021-07-11 ENCOUNTER — Other Ambulatory Visit (HOSPITAL_COMMUNITY)
Admission: RE | Admit: 2021-07-11 | Discharge: 2021-07-11 | Disposition: A | Discharge status: Home / Self Care | Payer: Medicaid Other | Source: Ambulatory Visit | Attending: Adult Health | Admitting: Adult Health

## 2021-07-11 ENCOUNTER — Ambulatory Visit (INDEPENDENT_AMBULATORY_CARE_PROVIDER_SITE_OTHER): Payer: Medicaid Other | Admitting: Adult Health

## 2021-07-11 ENCOUNTER — Encounter: Payer: Self-pay | Admitting: Adult Health

## 2021-07-11 VITALS — BP 129/76 | HR 73 | Ht 61.0 in | Wt 175.0 lb

## 2021-07-11 DIAGNOSIS — Z113 Encounter for screening for infections with a predominantly sexual mode of transmission: Secondary | ICD-10-CM | POA: Insufficient documentation

## 2021-07-11 DIAGNOSIS — Z0000 Encounter for general adult medical examination without abnormal findings: Secondary | ICD-10-CM | POA: Insufficient documentation

## 2021-07-11 DIAGNOSIS — N946 Dysmenorrhea, unspecified: Secondary | ICD-10-CM | POA: Insufficient documentation

## 2021-07-11 DIAGNOSIS — Z975 Presence of (intrauterine) contraceptive device: Secondary | ICD-10-CM | POA: Insufficient documentation

## 2021-07-11 DIAGNOSIS — Z01419 Encounter for gynecological examination (general) (routine) without abnormal findings: Secondary | ICD-10-CM | POA: Insufficient documentation

## 2021-07-11 MED ORDER — IBUPROFEN 800 MG PO TABS: 800.0000 mg | ORAL_TABLET | Freq: Three times a day (TID) | ORAL | 1 refills | Status: AC | PRN

## 2021-07-11 NOTE — Progress Notes (Signed)
Patient ID: Erica Doyle, female   DOB: August 31, 1995, 26 y.o.   MRN: [id number] History of Present Illness: Erica Doyle is a 26 year old black female,single, G1P1001, in for a well woman gyn exam. Lab Results  Component Value Date   DIAGPAP  04/06/2019    - Negative for intraepithelial lesion or malignancy (NILM)      Current Medications, Allergies, Past Medical History, Past Surgical History, Family History and Social History were reviewed in [employer] record.     Review of Systems: Patient denies any headaches, hearing loss, fatigue, blurred vision, shortness of breath, chest pain, abdominal pain, problems with bowel movements, urination, or intercourse. No joint pain or mood swings.  Has period cramps   Physical Exam:BP 129/76 (BP Location: Left Arm, Patient Position: Sitting, Cuff Size: Normal)   Pulse 73   Ht 5\' 1"  (1.549 m)   Wt 175 lb (79.4 kg)   LMP 07/05/2021   Breastfeeding No   BMI 33.07 kg/m   General:  Well developed, well nourished, no acute distress Skin:  Warm and dry Neck:  Midline trachea, normal thyroid, good ROM, no lymphadenopathy Lungs; Clear to auscultation bilaterally Breast:  No dominant palpable mass, retraction, or nipple discharge Cardiovascular: Regular rate and rhythm Abdomen:  Soft, non tender, no hepatosplenomegaly Pelvic:  External genitalia is normal in appearance, no lesions.  The vagina is normal in appearance.+period blood. CV swab obtained. Urethra has no lesions or masses. The cervix is bulbous.  Uterus is felt to be normal size, shape, and contour.  No adnexal masses or tenderness noted.Bladder is non tender, no masses felt. Extremities/musculoskeletal:  No swelling or varicosities noted, no clubbing or cyanosis Psych:  No mood changes, alert and cooperative,seems happy AA is 0 Fall risk is low    07/11/2021    3:02 PM 02/08/2020    8:55 AM 11/02/2019   11:43 AM  Depression screen PHQ 2/9  Decreased Interest 0 0 0   Down, Depressed, Hopeless 0 0 0  PHQ - 2 Score 0 0 0  Altered sleeping 1 0 0  Tired, decreased energy 0 0 1  Change in appetite 0 0 0  Feeling bad or failure about yourself  0 0 0  Trouble concentrating 0 0 0  Moving slowly or fidgety/restless 0 0 0  Suicidal thoughts 0 0 0  PHQ-9 Score 1 0 1       07/11/2021    3:03 PM 02/08/2020    8:55 AM 11/02/2019   11:44 AM  GAD 7 : Generalized Anxiety Score  Nervous, Anxious, on Edge 0 0 0  Control/stop worrying 0 0 0  Worry too much - different things 0 0 0  Trouble relaxing 0 0 0  Restless 0 0 0  Easily annoyed or irritable 0 0 1  Afraid - awful might happen 0 0 0  Total GAD 7 Score 0 0 1      Upstream - 07/11/21 1502       Pregnancy Intention Screening   Does the patient want to become pregnant in the next year? No    Does the patient's partner want to become pregnant in the next year? No    Would the patient like to discuss contraceptive options today? No      Contraception Wrap Up   Current Method Hormonal Implant    End Method Hormonal Implant             Examination chaperoned by 07/13/21 LPN  Impression and Plan: 1. Encounter for well woman exam with routine gynecological exam Pap and physical in 1 year   2. Nexplanon in place Placed 05/11/20  3. Screening examination for STD (sexually transmitted disease) CV swab sent for GC/CHL and trich She declines blood labs  - Cervicovaginal ancillary only( [place])  4. Menstrual cramps Will rx motrin 800 mg Meds ordered this encounter  Medications   ibuprofen (ADVIL) 800 MG tablet    Sig: Take 1 tablet (800 mg total) by mouth every 8 (eight) hours as needed.    Dispense:  60 tablet    Refill:  1    Order Specific Question:   Supervising Provider    Answer:   Duane Lope H [[id number]]

## 2021-07-15 LAB — CERVICOVAGINAL ANCILLARY ONLY
Chlamydia: NEGATIVE
Comment: NEGATIVE
Comment: NEGATIVE
Comment: NORMAL
Neisseria Gonorrhea: NEGATIVE
Trichomonas: NEGATIVE

## 2022-03-19 ENCOUNTER — Other Ambulatory Visit (INDEPENDENT_AMBULATORY_CARE_PROVIDER_SITE_OTHER): Payer: Medicaid Other | Admitting: *Deleted

## 2022-03-19 DIAGNOSIS — R300 Dysuria: Secondary | ICD-10-CM

## 2022-03-19 LAB — POCT URINALYSIS DIPSTICK OB
Glucose, UA: NEGATIVE
Ketones, UA: NEGATIVE
Nitrite, UA: NEGATIVE

## 2022-03-19 NOTE — Progress Notes (Signed)
   NURSE VISIT- UTI SYMPTOMS   SUBJECTIVE:  Erica Doyle is a 27 y.o. G74P1001 female here for UTI symptoms. She is a GYN patient. She reports dysuria.  OBJECTIVE:  There were no vitals taken for this visit.  Appears well, in no apparent distress  Results for orders placed or performed in visit on 03/19/22 (from the past 24 hour(s))  POC Urinalysis Dipstick OB   Collection Time: 03/19/22 10:50 AM  Result Value Ref Range   Color, UA     Clarity, UA     Glucose, UA Negative Negative   Bilirubin, UA     Ketones, UA neg    Spec Grav, UA     Blood, UA small    pH, UA     POC,PROTEIN,UA Small (1+) Negative, Trace, Small (1+), Moderate (2+), Large (3+), 4+   Urobilinogen, UA     Nitrite, UA neg    Leukocytes, UA Small (1+) (A) Negative   Appearance     Odor      ASSESSMENT: GYN patient with UTI symptoms and negative nitrites  PLAN: Note routed to Derrek Monaco, AGNP   Rx sent by provider today: No Urine culture sent Call or return to clinic prn if these symptoms worsen or fail to improve as anticipated. Follow-up: as needed   Janece Canterbury  03/19/2022 10:51 AM

## 2022-03-20 LAB — URINALYSIS
Bilirubin, UA: NEGATIVE
Glucose, UA: NEGATIVE
Ketones, UA: NEGATIVE
Nitrite, UA: NEGATIVE
Specific Gravity, UA: 1.029 (ref 1.005–1.030)
Urobilinogen, Ur: 0.2 mg/dL (ref 0.2–1.0)
pH, UA: 5.5 (ref 5.0–7.5)

## 2022-03-25 ENCOUNTER — Other Ambulatory Visit: Payer: Self-pay | Admitting: Adult Health

## 2022-03-25 LAB — URINE CULTURE

## 2022-03-25 MED ORDER — SULFAMETHOXAZOLE-TRIMETHOPRIM 800-160 MG PO TABS: 1.0000 | ORAL_TABLET | Freq: Two times a day (BID) | ORAL | 0 refills | Status: DC

## 2022-03-25 NOTE — Progress Notes (Signed)
+  urine culture for  E coli and GBS will rx septra ds

## 2022-07-16 ENCOUNTER — Other Ambulatory Visit (HOSPITAL_COMMUNITY)
Admission: RE | Admit: 2022-07-16 | Discharge: 2022-07-16 | Disposition: A | Discharge status: Home / Self Care | Payer: Medicaid Other | Source: Ambulatory Visit | Attending: Adult Health | Admitting: Adult Health

## 2022-07-16 ENCOUNTER — Ambulatory Visit (INDEPENDENT_AMBULATORY_CARE_PROVIDER_SITE_OTHER): Payer: Medicaid Other | Admitting: Adult Health

## 2022-07-16 ENCOUNTER — Encounter: Payer: Self-pay | Admitting: Adult Health

## 2022-07-16 VITALS — BP 125/87 | HR 75 | Ht 61.0 in | Wt 168.5 lb

## 2022-07-16 DIAGNOSIS — Z0000 Encounter for general adult medical examination without abnormal findings: Secondary | ICD-10-CM

## 2022-07-16 DIAGNOSIS — Z113 Encounter for screening for infections with a predominantly sexual mode of transmission: Secondary | ICD-10-CM | POA: Diagnosis not present

## 2022-07-16 DIAGNOSIS — Z3009 Encounter for other general counseling and advice on contraception: Secondary | ICD-10-CM | POA: Diagnosis not present

## 2022-07-16 DIAGNOSIS — Z01419 Encounter for gynecological examination (general) (routine) without abnormal findings: Secondary | ICD-10-CM

## 2022-07-16 DIAGNOSIS — Z975 Presence of (intrauterine) contraceptive device: Secondary | ICD-10-CM

## 2022-07-16 NOTE — Progress Notes (Signed)
Patient ID: Erica Doyle, female   DOB: Jul 25, 1995, 27 y.o.   MRN: [id number] History of Present Illness: Erica Doyle is a 27 year old black female,single, G1P1001, in for a well woman gyn exam and pap, she has family planning medicaid.   Current Medications, Allergies, Past Medical History, Past Surgical History, Family History and Social History were reviewed in [employer] record.     Review of Systems: Patient denies any headaches, hearing loss, fatigue, blurred vision, shortness of breath, chest pain, abdominal pain, problems with bowel movements, urination, or intercourse. No joint pain or mood swings.     Physical Exam:BP 125/87 (BP Location: Left Arm, Patient Position: Sitting, Cuff Size: Normal)   Pulse 75   Ht 5\' 1"  (1.549 m)   Wt 168 lb 8 oz (76.4 kg)   LMP 07/10/2022   BMI 31.84 kg/m   General:  Well developed, well nourished, no acute distress Skin:  Warm and dry Neck:  Midline trachea, normal thyroid, good ROM, no lymphadenopathy Lungs; Clear to auscultation bilaterally Breast:  No dominant palpable mass, retraction, or nipple discharge Cardiovascular: Regular rate and rhythm Abdomen:  Soft, non tender, no hepatosplenomegaly Pelvic:  External genitalia is normal in appearance, no lesions.  The vagina is normal in appearance, scant blood. Urethra has no lesions or masses. The cervix is bulbous, pap with GC/CHL and HR HPV genotyping performed.  Uterus is felt to be normal size, shape, and contour.  No adnexal masses or tenderness noted.Bladder is non tender, no masses felt. Extremities/musculoskeletal:  No swelling or varicosities noted, no clubbing or cyanosis Psych:  No mood changes, alert and cooperative,seems happy AA is 0 Fall risk is low    07/16/2022    9:07 AM 07/11/2021    3:02 PM 02/08/2020    8:55 AM  Depression screen PHQ 2/9  Decreased Interest 0 0 0  Down, Depressed, Hopeless 0 0 0  PHQ - 2 Score 0 0 0  Altered sleeping 1 1 0   Tired, decreased energy 0 0 0  Change in appetite 1 0 0  Feeling bad or failure about yourself  0 0 0  Trouble concentrating 0 0 0  Moving slowly or fidgety/restless 0 0 0  Suicidal thoughts 0 0 0  PHQ-9 Score 2 1 0       07/16/2022    9:07 AM 07/11/2021    3:03 PM 02/08/2020    8:55 AM 11/02/2019   11:44 AM  GAD 7 : Generalized Anxiety Score  Nervous, Anxious, on Edge 0 0 0 0  Control/stop worrying 0 0 0 0  Worry too much - different things 0 0 0 0  Trouble relaxing 0 0 0 0  Restless 0 0 0 0  Easily annoyed or irritable 0 0 0 1  Afraid - awful might happen 0 0 0 0  Total GAD 7 Score 0 0 0 1      Upstream - 07/16/22 0910       Pregnancy Intention Screening   Does the patient want to become pregnant in the next year? No    Does the patient's partner want to become pregnant in the next year? No    Would the patient like to discuss contraceptive options today? No      Contraception Wrap Up   Current Method Hormonal Implant    End Method Hormonal Implant            Examination chaperoned by Malachy Mood LPN  Impression and Plan: 1. Family planning Pap sent Check HIV and RPR - Cytology - PAP( [place]) - HIV Antibody (routine testing w rflx) - RPR  2. Routine general medical examination at a health care facility Pap sent Pap in 3 years if normal Physical in 1 year - Cytology - PAP( [place])  3. Encounter for gynecological examination with Papanicolaou smear of cervix Pap sent  4. Screening examination for STD (sexually transmitted disease) Check GC/CHL on pap Check HIV and RPR - HIV Antibody (routine testing w rflx) - RPR  5. Nexplanon in place Placed 05/11/20 Remove March 2025, not sure if wants replaced or not yet.

## 2022-07-17 LAB — HIV ANTIBODY (ROUTINE TESTING W REFLEX): HIV Screen 4th Generation wRfx: NONREACTIVE

## 2022-07-17 LAB — RPR: RPR Ser Ql: NONREACTIVE

## 2022-07-24 LAB — CYTOLOGY - PAP
Chlamydia: POSITIVE — AB
Comment: NEGATIVE
Comment: NEGATIVE
Comment: NEGATIVE
Comment: NEGATIVE
Comment: NORMAL
Diagnosis: UNDETERMINED — AB
HPV 16: NEGATIVE
HPV 18 / 45: NEGATIVE
High risk HPV: POSITIVE — AB
Neisseria Gonorrhea: NEGATIVE

## 2022-07-25 ENCOUNTER — Other Ambulatory Visit: Payer: Self-pay | Admitting: Adult Health

## 2022-07-25 ENCOUNTER — Encounter: Payer: Self-pay | Admitting: Adult Health

## 2022-07-25 DIAGNOSIS — R87610 Atypical squamous cells of undetermined significance on cytologic smear of cervix (ASC-US): Secondary | ICD-10-CM | POA: Insufficient documentation

## 2022-07-25 DIAGNOSIS — A749 Chlamydial infection, unspecified: Secondary | ICD-10-CM | POA: Insufficient documentation

## 2022-07-25 DIAGNOSIS — R87619 Unspecified abnormal cytological findings in specimens from cervix uteri: Secondary | ICD-10-CM | POA: Insufficient documentation

## 2022-07-25 MED ORDER — DOXYCYCLINE HYCLATE 100 MG PO TABS: 100.0000 mg | ORAL_TABLET | Freq: Two times a day (BID) | ORAL | 0 refills | Status: DC

## 2022-07-29 ENCOUNTER — Encounter: Payer: Self-pay | Admitting: Women's Health

## 2022-07-29 ENCOUNTER — Other Ambulatory Visit (HOSPITAL_COMMUNITY)
Admission: RE | Admit: 2022-07-29 | Discharge: 2022-07-29 | Disposition: A | Discharge status: Home / Self Care | Payer: Medicaid Other | Source: Ambulatory Visit | Attending: Women's Health | Admitting: Women's Health

## 2022-07-29 ENCOUNTER — Ambulatory Visit (INDEPENDENT_AMBULATORY_CARE_PROVIDER_SITE_OTHER): Payer: Self-pay | Admitting: Women's Health

## 2022-07-29 VITALS — BP 134/86 | HR 92 | Ht 61.0 in | Wt 166.0 lb

## 2022-07-29 DIAGNOSIS — R87610 Atypical squamous cells of undetermined significance on cytologic smear of cervix (ASC-US): Secondary | ICD-10-CM | POA: Insufficient documentation

## 2022-07-29 DIAGNOSIS — R87810 Cervical high risk human papillomavirus (HPV) DNA test positive: Secondary | ICD-10-CM

## 2022-07-29 DIAGNOSIS — Z3202 Encounter for pregnancy test, result negative: Secondary | ICD-10-CM

## 2022-07-29 NOTE — Addendum Note (Signed)
Addended by: Federico Flake A on: 07/29/2022 09:39 AM   Modules accepted: Orders

## 2022-07-29 NOTE — Progress Notes (Signed)
   COLPOSCOPY PROCEDURE NOTE Patient name: Erica Doyle MRN [id number]  Date of birth: [birth year] Subjective Findings:   Erica Doyle is a 27 y.o. G73P1001 African American female being seen today for a colposcopy. Also had +CT, still taking doxycycline. Partner was treated.  Indication: Abnormal pap on 07/16/22: ASCUS w/ HRHPV positive: other (not 16, 18/45)  Prior cytology:  Date Result Procedure  04/06/19 NILM w/ HRHPV not done None              Patient's last menstrual period was 07/10/2022. Contraception: Nexplanon. Menopausal: no. Hysterectomy: no.   Smoker: no. Immunocompromised: no.   The risks and benefits were explained and informed consent was obtained, and written copy is in chart. Pertinent History Reviewed:   Reviewed past medical,surgical, social, obstetrical and family history.  Reviewed problem list, medications and allergies. Objective Findings & Procedure:   Vitals:   07/29/22 0904  BP: 134/86  Pulse: 92  Weight: 166 lb (75.3 kg)  Height: 5\' 1"  (1.549 m)  Body mass index is 31.37 kg/m.  No results found for this or any previous visit (from the past 24 hour(s)).   Time out was performed.  Speculum placed in the vagina, cervix fully visualized. SCJ: fully visualized. Cervix swabbed x 3 with acetic acid.  Acetowhitening present: Yes Cervix: no visible lesions, no mosaicism, no punctation, and acetowhite lesion(s) noted at 11-1 o'clock. Cervical biopsies taken at 12 o'clock and Hemostasis achieved with Monsel's solution. Vagina: vaginal colposcopy not performed Vulva: vulvar colposcopy not performed  Specimens: 1  Complications: none  Chaperone: Peggy Dones  & Dr. Yetta Barre (observed)  Colposcopic Impression & Plan:   Colposcopy findings consistent with LSIL Plan: Post biopsy instructions given, Will notify patient of results when back, and Will base plan of care on pathology results and ASCCP guidelines  2. Recent +CT> still taking doxy, partner treated,  no sex until at least 7d from time both have finished meds. Return in few weeks for POC  Return in about 4 weeks (around 08/26/2022) for nurse self-swab visit; then 65yr for pap & physical.  Cheral Marker CNM, [facility] 07/29/2022 9:13 AM

## 2022-07-29 NOTE — Patient Instructions (Signed)
Colposcopy, Care After  The following information offers guidance on how to care for yourself after your procedure. Your health care provider may also give you more specific instructions. If you have problems or questions, contact your health care provider. What can I expect after the procedure? If you had a colposcopy without a biopsy, you can expect to feel fine right away after your procedure. However, you may have some spotting of blood for a few days. You can return to your normal activities. If you had a colposcopy with a biopsy, it is common after the procedure to have: Soreness and mild pain. These may last for a few days. Mild vaginal bleeding or discharge that is dark-colored and grainy. This may last for a few days. The discharge may be caused by a liquid (solution) that was used during the procedure. You may need to wear a sanitary pad during this time. Spotting of blood for at least 48 hours after the procedure. Follow these instructions at home: Medicines Take over-the-counter and prescription medicines only as told by your health care provider. Talk with your health care provider about what type of over-the-counter pain medicines and prescription medicines you can start to take again. It is especially important to talk with your health care provider if you take blood thinners. Activity Avoid using douche products, using tampons, and having sex for at least 3 days after the procedure or for as long as told by your health care provider. Return to your normal activities as told by your health care provider. Ask your health care provider what activities are safe for you. General instructions Ask your health care provider if you may take baths, swim, or use a hot tub. You may take showers. If you use birth control (contraception), continue to use it. Keep all follow-up visits. This is important. Contact a health care provider if: You have a fever or chills. You faint or feel  light-headed. Get help right away if: You have heavy bleeding from your vagina or pass blood clots. Heavy bleeding is bleeding that soaks through a sanitary pad in less than 1 hour. You have vaginal discharge that is abnormal, is yellow in color, or smells bad. This could be a sign of infection. You have severe pain or cramps in your lower abdomen that do not go away with medicine. Summary If you had a colposcopy without a biopsy, you can expect to feel fine right away, but you may have some spotting of blood for a few days. You can return to your normal activities. If you had a colposcopy with a biopsy, it is common to have mild pain for a few days and spotting for 48 hours after the procedure. Avoid using douche products, using tampons, and having sex for at least 3 days after the procedure or for as long as told by your health care provider. Get help right away if you have heavy bleeding, severe pain, or signs of infection. This information is not intended to replace advice given to you by your health care provider. Make sure you discuss any questions you have with your health care provider. Document Revised: 07/08/2020 Document Reviewed: 07/08/2020 Elsevier Patient Education  2024 Elsevier Inc.  

## 2022-07-31 LAB — SURGICAL PATHOLOGY

## 2022-08-01 ENCOUNTER — Other Ambulatory Visit: Payer: Self-pay | Admitting: Adult Health

## 2022-08-04 ENCOUNTER — Encounter: Payer: Self-pay | Admitting: Women's Health

## 2022-08-04 ENCOUNTER — Telehealth: Payer: Self-pay

## 2022-08-04 NOTE — Telephone Encounter (Signed)
Called patient @ 3:50 pm and left message to check message in Mychart that Joellyn Haff had left on her recent labs.

## 2022-08-12 ENCOUNTER — Ambulatory Visit: Payer: Medicaid Other | Admitting: Obstetrics & Gynecology

## 2022-08-14 ENCOUNTER — Encounter: Payer: Medicaid Other | Admitting: Physician Assistant

## 2022-08-14 NOTE — Progress Notes (Signed)
Scheduled in error. Attempting to schedule with OB/GYN.  This encounter was created in error - please disregard.

## 2022-08-26 ENCOUNTER — Other Ambulatory Visit (HOSPITAL_COMMUNITY)
Admission: RE | Admit: 2022-08-26 | Discharge: 2022-08-26 | Disposition: A | Discharge status: Home / Self Care | Payer: Self-pay | Source: Ambulatory Visit | Attending: Obstetrics & Gynecology | Admitting: Obstetrics & Gynecology

## 2022-08-26 ENCOUNTER — Other Ambulatory Visit (INDEPENDENT_AMBULATORY_CARE_PROVIDER_SITE_OTHER): Payer: Self-pay

## 2022-08-26 DIAGNOSIS — Z8619 Personal history of other infectious and parasitic diseases: Secondary | ICD-10-CM | POA: Insufficient documentation

## 2022-08-26 DIAGNOSIS — Z09 Encounter for follow-up examination after completed treatment for conditions other than malignant neoplasm: Secondary | ICD-10-CM | POA: Insufficient documentation

## 2022-08-26 DIAGNOSIS — Z113 Encounter for screening for infections with a predominantly sexual mode of transmission: Secondary | ICD-10-CM

## 2022-08-26 NOTE — Progress Notes (Signed)
   NURSE VISIT-POC  SUBJECTIVE:  Erica Doyle is a 27 y.o. G1P1001 GYN patientfemale here for a vaginal swab for proof of cure after treatment for Chlamydia.  She reports the following symptoms: none Denies abnormal vaginal bleeding, significant pelvic pain, fever, or UTI symptoms.  OBJECTIVE:  There were no vitals taken for this visit.  Appears well, in no apparent distress  ASSESSMENT: Vaginal swab for proof of cure after treatment for chlamydia  PLAN: Self-collected vaginal probe for Chlamydia sent to lab Treatment: to be determined once results are received Follow-up as needed if symptoms persist/worsen, or new symptoms develop  Jobe Marker  08/26/2022 11:00 AM

## 2022-08-27 LAB — CERVICOVAGINAL ANCILLARY ONLY
Chlamydia: NEGATIVE
Comment: NEGATIVE
Comment: NORMAL
Neisseria Gonorrhea: NEGATIVE

## 2022-09-17 ENCOUNTER — Encounter: Payer: Self-pay | Admitting: Obstetrics & Gynecology

## 2022-09-17 ENCOUNTER — Ambulatory Visit (INDEPENDENT_AMBULATORY_CARE_PROVIDER_SITE_OTHER): Payer: Self-pay | Admitting: Obstetrics & Gynecology

## 2022-09-17 VITALS — BP 124/87 | HR 85 | Ht 61.0 in | Wt 163.2 lb

## 2022-09-17 DIAGNOSIS — Z3009 Encounter for other general counseling and advice on contraception: Secondary | ICD-10-CM

## 2022-09-17 DIAGNOSIS — N871 Moderate cervical dysplasia: Secondary | ICD-10-CM

## 2022-09-17 NOTE — Progress Notes (Signed)
   GYN VISIT Patient name: Erica Doyle MRN [id number]  Date of birth: [birth year] Chief Complaint:   Results (Discuss colpo results)  History of Present Illness:   Erica Doyle is a 27 y.o. G35P1001 female being seen today for follow up regarding:  -Cervical dysplasia:  In review: May 2024: ASCUS, HPV+, colpo showed CIN 2  Patient does desire another pregnancy in the future.  She reports no issues with her periods and denies postcoital bleeding.  Of note since her recent colposcopy she has not yet been sexually active.  She reports no acute GYN concerns  No LMP recorded. Patient has had an implant.    Review of Systems:   Pertinent items are noted in HPI Denies fever/chills, dizziness, headaches, visual disturbances, fatigue, shortness of breath, chest pain, abdominal pain, vomiting, no problems with periods, bowel movements, urination, or intercourse unless otherwise stated above.  Pertinent History Reviewed:   Past Surgical History:  Procedure Laterality Date   KNEE SURGERY Left     Past Medical History:  Diagnosis Date   Medical history non-contributory    Nexplanon insertion 12/31/2012   nexplanon inserted left arm 12/31/12 remove 01/01/16   Trichimoniasis 04/07/2019   Treated 04/07/19,  Poc____________   Reviewed problem list, medications and allergies. Physical Assessment:   Vitals:   09/17/22 1531  BP: 124/87  Pulse: 85  Weight: 163 lb 3.2 oz (74 kg)  Height: 5\' 1"  (1.549 m)  Body mass index is 30.84 kg/m.       Physical Examination:   General appearance: alert, well appearing, and in no distress  Psych: mood appropriate, normal affect  Skin: warm & dry   Cardiovascular: normal heart rate noted  Respiratory: normal respiratory effort, no distress  Abdomen: soft, non-tender   Pelvic: examination not indicated  Extremities: no edema   Chaperone: N/A    Assessment & Plan:  1) cervical dysplasia -Reviewed ASCCP guidelines -Based on young age and desire  for future pregnancy would advise observation -Per guidelines we will plan for 6 and 55-month Pap/colposcopy -Discussed importance of close follow-up -Reviewed concerns regarding sexual activity    Return in about 6 months (around 03/20/2023) for Pap/Colpo- either Dr. Charlotta Newton or Grace Bushy.   [name], DO Attending Obstetrician & Gynecologist, [facility] for [employer], [facility]

## 2023-03-23 ENCOUNTER — Ambulatory Visit: Payer: Medicaid Other | Admitting: Obstetrics & Gynecology

## 2023-05-07 ENCOUNTER — Ambulatory Visit (INDEPENDENT_AMBULATORY_CARE_PROVIDER_SITE_OTHER): Payer: Medicaid Other | Admitting: Adult Health

## 2023-05-07 ENCOUNTER — Encounter: Payer: Self-pay | Admitting: Adult Health

## 2023-05-07 VITALS — BP 129/85 | HR 85 | Ht 61.0 in | Wt 167.0 lb

## 2023-05-07 DIAGNOSIS — Z3202 Encounter for pregnancy test, result negative: Secondary | ICD-10-CM | POA: Diagnosis not present

## 2023-05-07 DIAGNOSIS — N871 Moderate cervical dysplasia: Secondary | ICD-10-CM | POA: Insufficient documentation

## 2023-05-07 DIAGNOSIS — Z3046 Encounter for surveillance of implantable subdermal contraceptive: Secondary | ICD-10-CM | POA: Insufficient documentation

## 2023-05-07 LAB — POCT URINE PREGNANCY: Preg Test, Ur: NEGATIVE

## 2023-05-07 NOTE — Patient Instructions (Addendum)
Use condoms, keep clean and dry x 24 hours, no heavy lifting, keep steri strips on x 72 hours, Keep pressure dressing on x 24 hours. Follow up prn problems.  

## 2023-05-07 NOTE — Progress Notes (Signed)
  Subjective:     Patient ID: Erica Doyle, female   DOB: December 31, 1995, 28 y.o.   MRN: [id number]  HPI Erica Doyle is a 28 year old black female,single, G1P1001, in for Nexplanon removal. She says she had +HPT Monday.      Component Value Date/Time   DIAGPAP (A) 07/16/2022 0913    - Atypical squamous cells of undetermined significance (ASC-US)   DIAGPAP  04/06/2019 0957    - Negative for intraepithelial lesion or malignancy (NILM)   HPVHIGH Positive (A) 07/16/2022 0913   ADEQPAP  07/16/2022 0913    Satisfactory for evaluation; transformation zone component PRESENT.   ADEQPAP  04/06/2019 0957    Satisfactory for evaluation; transformation zone component ABSENT.   Had COLPO 07/29/22 CIN 2, needed pap at 6 and 12 months, missed 6 month pap will schedule   Review of Systems For nexplanon removal Had spotting and then period 05/04/23 had +HPT   Reviewed past medical,surgical, social and family history. Reviewed medications and allergies.  Objective:   Physical Exam BP 129/85 (BP Location: Left Arm, Patient Position: Sitting, Cuff Size: Normal)   Pulse 85   Ht 5\' 1"  (1.549 m)   Wt 167 lb (75.8 kg)   LMP 05/04/2023 (Exact Date)   BMI 31.55 kg/m  UPT is negative    Consent signed and time out called.  Left arm cleansed with betadine, and injected with 1.5 cc 2% lidocaine and waited til numb.Under sterile technique a #11 blade was used to make small vertical incision, and a curved forceps was used to easily remove rod. Steri strips applied. Pressure dressing applied.  Fall risk is low  Upstream - 05/07/23 0927       Pregnancy Intention Screening   Does the patient want to become pregnant in the next year? No    Does the patient's partner want to become pregnant in the next year? No    Would the patient like to discuss contraceptive options today? No      Contraception Wrap Up   Current Method Hormonal Implant    End Method Female Condom             Assessment:     1. Encounter for  Nexplanon removal (Primary) Nexplanon easily removed Use condoms, keep clean and dry x 24 hours, no heavy lifting, keep steri strips on x 72 hours, Keep pressure dressing on x 24 hours. Follow up prn problems.   2. Dysplasia of cervix, high grade CIN 2 Return for pap in 2 weeks   3. Pregnancy test negative UPT is negative Take OTC PNV    Plan:     Return in 2 weeks for pap

## 2023-05-20 ENCOUNTER — Other Ambulatory Visit (HOSPITAL_COMMUNITY)
Admission: RE | Admit: 2023-05-20 | Discharge: 2023-05-20 | Disposition: A | Discharge status: Home / Self Care | Source: Ambulatory Visit | Attending: Adult Health | Admitting: Adult Health

## 2023-05-20 ENCOUNTER — Encounter: Payer: Self-pay | Admitting: Adult Health

## 2023-05-20 ENCOUNTER — Ambulatory Visit (INDEPENDENT_AMBULATORY_CARE_PROVIDER_SITE_OTHER): Payer: Self-pay | Admitting: Adult Health

## 2023-05-20 VITALS — BP 134/79 | HR 85 | Ht 61.0 in | Wt 170.6 lb

## 2023-05-20 DIAGNOSIS — N871 Moderate cervical dysplasia: Secondary | ICD-10-CM | POA: Insufficient documentation

## 2023-05-20 DIAGNOSIS — Z124 Encounter for screening for malignant neoplasm of cervix: Secondary | ICD-10-CM | POA: Diagnosis present

## 2023-05-20 NOTE — Progress Notes (Signed)
  Subjective:     Patient ID: Erica Doyle, female   DOB: [birth year], 28 y.o.   MRN: [id number]  HPI Erica Doyle is a 28 year old black female, single G1P1 in for pap.  She had colp 07/29/22 CIN2 missed pap/colp in January.   Review of Systems For pap only Had period 05/04/23 Reviewed past medical,surgical, social and family history. Reviewed medications and allergies.     Objective:   Physical Exam BP 134/79 (BP Location: Right Arm, Patient Position: Sitting, Cuff Size: Normal)   Pulse 85   Ht 5\' 1"  (1.549 m)   Wt 170 lb 9.6 oz (77.4 kg)   LMP 05/04/2023 (Exact Date)   BMI 32.23 kg/m     Skin warm and dry.Pelvic: external genitalia is normal in appearance no lesions, vagina: pink,urethra has no lesions or masses noted, cervix:smooth and bulbous, pap with GC/CHL and HR HPV genotyping performed,uterus: normal size, shape and contour, non tender, no masses felt, adnexa: no masses or tenderness noted. Bladder is non tender and no masses felt.  Upstream - 05/20/23 1600       Pregnancy Intention Screening   Does the patient want to become pregnant in the next year? Ok Either Way    Does the patient's partner want to become pregnant in the next year? Ok Either Way    Would the patient like to discuss contraceptive options today? No      Contraception Wrap Up   Current Method Female Condom    End Method Female Condom    Contraception Counseling Provided No            Examination chaperoned by Faith Rogue LPN Assessment:     1. Routine cervical smear (Primary) Pap sent  - Cytology - PAP( [place])  2. Dysplasia of cervix, high grade CIN 2 Pap sent Return in 1 week for colpo with Joellyn Haff CNM  - Cytology - PAP( [place])     Plan:     Return in 1 week for colpo

## 2023-05-26 ENCOUNTER — Other Ambulatory Visit: Payer: Self-pay | Admitting: Adult Health

## 2023-05-26 DIAGNOSIS — A749 Chlamydial infection, unspecified: Secondary | ICD-10-CM

## 2023-05-26 LAB — CYTOLOGY - PAP
Chlamydia: POSITIVE — AB
Comment: NEGATIVE
Comment: NEGATIVE
Comment: NORMAL
Diagnosis: NEGATIVE
High risk HPV: NEGATIVE
Neisseria Gonorrhea: NEGATIVE

## 2023-05-26 MED ORDER — METRONIDAZOLE 500 MG PO TABS: 500.0000 mg | ORAL_TABLET | Freq: Two times a day (BID) | ORAL | 0 refills | Status: AC

## 2023-05-26 MED ORDER — DOXYCYCLINE HYCLATE 100 MG PO TABS: 100.0000 mg | ORAL_TABLET | Freq: Two times a day (BID) | ORAL | 0 refills | Status: AC

## 2023-05-26 NOTE — Progress Notes (Signed)
+  chlamydia on pap, rx doxycycline and +BV on pap rx flagyl

## 2023-05-27 ENCOUNTER — Encounter: Payer: Self-pay | Admitting: Women's Health

## 2023-05-27 ENCOUNTER — Encounter: Admitting: Women's Health

## 2023-05-27 NOTE — Progress Notes (Signed)
 This encounter was created in error - please disregard. Pt was scheduled for colpo based on note from Dr. Charlotta Newton after colpo last year, however pap 05/20/23: NILM w/ -HRHPV, discussed w/ Dr. Charlotta Newton, no colpo needed.  F/U 4wks for CT+ POC w/ nurse Cheral Marker, CNM, [facility] 05/27/2023 2:53 PM

## 2023-05-27 NOTE — Progress Notes (Deleted)
   COLPOSCOPY PROCEDURE NOTE Patient name: Erica Doyle MRN [id number]  Date of birth: [birth year] Subjective Findings:   Erica Doyle is a 28 y.o. G1P1001 {Race/ethnicity:17218} female being seen today for a colposcopy. Indication: {Colpo indications:25133}  Prior cytology:  Date Result Procedure   {Pap findings:25134} {Cervical procedures:25135}   {Pap findings:25134} {Cervical procedures:25135}   {Pap findings:25134} {Cervical procedures:25135}   {Pap findings:25134} {Cervical procedures:25135}  Patient's last menstrual period was 05/04/2023 (exact date). Contraception: {contraceptive methods:5051}. Menopausal: {yes***/no:23838}. Hysterectomy: {yes***/no:23838}.   Considering pregnancy: {Responses; yes/no/unknown/maybe/na:33144} New sex partner: {yes***/no:23838} Smoker: {tobacco use:25405::"no"}. Immunocompromised: {yes***/no:23838}.   The risks and benefits were explained and informed consent was obtained, and written copy is in chart. Pertinent History Reviewed:   Reviewed past medical,surgical, social, obstetrical and family history.  Reviewed problem list, medications and allergies. Objective Findings & Procedure:   Vitals:   05/27/23 1410  BP: 124/82  Pulse: 77  Weight: 168 lb (76.2 kg)  Height: 5\' 1"  (1.549 m)  Body mass index is 31.74 kg/m.  No results found for this or any previous visit (from the past 24 hours).   Time out was performed.  Speculum placed in the vagina, cervix {fully visualized:25093}. SCJ: {fully visualized:25093}. Cervix swabbed x 3 with acetic acid.  Acetowhitening present: {yes/no:20286} Cervix: {Findings; colposcopy:728}. {colpo procedures:25529}. Vagina: {Findings; colposcopy:730} Vulva: {Exam; colposcopy vulvar:731}  Specimens: {numbers:25136}  Complications: {complications:13423}  Chaperone: {FT Chaperones:32393::"N/A"}  Colposcopic Impression & Plan:   {colpo impression:25094} Plan: {colpoplan:25140}  No follow-ups on  file.  Cheral Marker CNM, [facility] 05/27/2023 2:31 PM

## 2023-06-24 ENCOUNTER — Other Ambulatory Visit (HOSPITAL_COMMUNITY)
Admission: RE | Admit: 2023-06-24 | Discharge: 2023-06-24 | Disposition: A | Discharge status: Home / Self Care | Source: Ambulatory Visit | Attending: Obstetrics & Gynecology | Admitting: Obstetrics & Gynecology

## 2023-06-24 ENCOUNTER — Ambulatory Visit (INDEPENDENT_AMBULATORY_CARE_PROVIDER_SITE_OTHER): Payer: Self-pay | Admitting: *Deleted

## 2023-06-24 DIAGNOSIS — Z8619 Personal history of other infectious and parasitic diseases: Secondary | ICD-10-CM | POA: Diagnosis present

## 2023-06-24 DIAGNOSIS — Z09 Encounter for follow-up examination after completed treatment for conditions other than malignant neoplasm: Secondary | ICD-10-CM | POA: Insufficient documentation

## 2023-06-24 NOTE — Progress Notes (Signed)
   NURSE VISIT- VAGINITIS/STD/POC  SUBJECTIVE:  Erica Doyle is a 28 y.o. G1P1001 GYN patientfemale here for a vaginal swab for proof of cure after treatment for Chlamydia.  She reports the following symptoms: none for 0 days. Denies abnormal vaginal bleeding, significant pelvic pain, fever, or UTI symptoms.  OBJECTIVE:  There were no vitals taken for this visit.  Appears well, in no apparent distress  ASSESSMENT: Vaginal swab for proof of cure after treatment for chlamydia  PLAN: Self-collected vaginal probe for Chlamydia sent to lab Treatment: to be determined once results are received Follow-up as needed if symptoms persist/worsen, or new symptoms develop  Kerrie Peek  06/24/2023 2:30 PM

## 2023-06-26 LAB — CERVICOVAGINAL ANCILLARY ONLY
Chlamydia: NEGATIVE
Comment: NEGATIVE
Comment: NORMAL
Neisseria Gonorrhea: NEGATIVE

## 2024-01-06 ENCOUNTER — Telehealth: Admitting: Family Medicine

## 2024-01-06 DIAGNOSIS — B3731 Acute candidiasis of vulva and vagina: Secondary | ICD-10-CM | POA: Diagnosis not present

## 2024-01-06 MED ORDER — FLUCONAZOLE 150 MG PO TABS: 150.0000 mg | ORAL_TABLET | ORAL | 0 refills | Status: AC

## 2024-01-06 NOTE — Progress Notes (Signed)
